# Patient Record
Sex: Female | Born: 1955 | Race: White | Hispanic: No | Marital: Married | Smoking: Never smoker
Health system: Southern US, Community
[De-identification: ages and names within clinical notes are randomized; demographics above are authoritative.]

## PROBLEM LIST (undated history)

## (undated) DIAGNOSIS — E119 Type 2 diabetes mellitus without complications: Secondary | ICD-10-CM

## (undated) DIAGNOSIS — K5790 Diverticulosis of intestine, part unspecified, without perforation or abscess without bleeding: Secondary | ICD-10-CM

## (undated) DIAGNOSIS — C50919 Malignant neoplasm of unspecified site of unspecified female breast: Secondary | ICD-10-CM

## (undated) DIAGNOSIS — K219 Gastro-esophageal reflux disease without esophagitis: Secondary | ICD-10-CM

## (undated) DIAGNOSIS — G620 Drug-induced polyneuropathy: Secondary | ICD-10-CM

## (undated) DIAGNOSIS — I4891 Unspecified atrial fibrillation: Secondary | ICD-10-CM

## (undated) DIAGNOSIS — Z9889 Other specified postprocedural states: Secondary | ICD-10-CM

## (undated) DIAGNOSIS — I10 Essential (primary) hypertension: Secondary | ICD-10-CM

## (undated) DIAGNOSIS — C439 Malignant melanoma of skin, unspecified: Secondary | ICD-10-CM

## (undated) DIAGNOSIS — K269 Duodenal ulcer, unspecified as acute or chronic, without hemorrhage or perforation: Secondary | ICD-10-CM

## (undated) DIAGNOSIS — K449 Diaphragmatic hernia without obstruction or gangrene: Secondary | ICD-10-CM

## (undated) DIAGNOSIS — D126 Benign neoplasm of colon, unspecified: Secondary | ICD-10-CM

## (undated) DIAGNOSIS — T4145XA Adverse effect of unspecified anesthetic, initial encounter: Secondary | ICD-10-CM

## (undated) DIAGNOSIS — T451X5A Adverse effect of antineoplastic and immunosuppressive drugs, initial encounter: Secondary | ICD-10-CM

## (undated) DIAGNOSIS — K259 Gastric ulcer, unspecified as acute or chronic, without hemorrhage or perforation: Secondary | ICD-10-CM

## (undated) DIAGNOSIS — M1990 Unspecified osteoarthritis, unspecified site: Secondary | ICD-10-CM

## (undated) DIAGNOSIS — L409 Psoriasis, unspecified: Secondary | ICD-10-CM

## (undated) DIAGNOSIS — R112 Nausea with vomiting, unspecified: Secondary | ICD-10-CM

## (undated) DIAGNOSIS — E7800 Pure hypercholesterolemia, unspecified: Secondary | ICD-10-CM

## (undated) DIAGNOSIS — T8859XA Other complications of anesthesia, initial encounter: Secondary | ICD-10-CM

## (undated) HISTORY — DX: Duodenal ulcer, unspecified as acute or chronic, without hemorrhage or perforation: K26.9

## (undated) HISTORY — PX: KNEE ARTHROSCOPY: SUR90

## (undated) HISTORY — DX: Unspecified osteoarthritis, unspecified site: M19.90

## (undated) HISTORY — DX: Gastro-esophageal reflux disease without esophagitis: K21.9

## (undated) HISTORY — DX: Malignant neoplasm of unspecified site of unspecified female breast: C50.919

## (undated) HISTORY — DX: Essential (primary) hypertension: I10

## (undated) HISTORY — DX: Diverticulosis of intestine, part unspecified, without perforation or abscess without bleeding: K57.90

## (undated) HISTORY — PX: SUPERFICIAL LYMPH NODE BIOPSY / EXCISION: SUR127

## (undated) HISTORY — DX: Psoriasis, unspecified: L40.9

## (undated) HISTORY — DX: Drug-induced polyneuropathy: G62.0

## (undated) HISTORY — DX: Gastric ulcer, unspecified as acute or chronic, without hemorrhage or perforation: K25.9

## (undated) HISTORY — PX: BREAST LUMPECTOMY: SHX2

## (undated) HISTORY — DX: Pure hypercholesterolemia, unspecified: E78.00

## (undated) HISTORY — DX: Malignant melanoma of skin, unspecified: C43.9

## (undated) HISTORY — DX: Unspecified atrial fibrillation: I48.91

## (undated) HISTORY — DX: Type 2 diabetes mellitus without complications: E11.9

## (undated) HISTORY — PX: INCONTINENCE SURGERY: SHX676

## (undated) HISTORY — DX: Diaphragmatic hernia without obstruction or gangrene: K44.9

## (undated) HISTORY — DX: Benign neoplasm of colon, unspecified: D12.6

## (undated) HISTORY — DX: Adverse effect of antineoplastic and immunosuppressive drugs, initial encounter: T45.1X5A

---

## 1999-03-16 HISTORY — PX: ESOPHAGOGASTRODUODENOSCOPY ENDOSCOPY: SHX5814

## 2008-12-25 ENCOUNTER — Ambulatory Visit: Payer: Self-pay | Admitting: Cardiology

## 2009-05-02 ENCOUNTER — Ambulatory Visit: Payer: Self-pay | Admitting: Cardiology

## 2009-05-28 ENCOUNTER — Ambulatory Visit: Admission: RE | Admit: 2009-05-28 | Discharge: 2009-08-26 | Payer: Self-pay | Admitting: Radiation Oncology

## 2009-11-13 DIAGNOSIS — D126 Benign neoplasm of colon, unspecified: Secondary | ICD-10-CM

## 2009-11-13 HISTORY — PX: COLONOSCOPY: SHX174

## 2009-11-13 HISTORY — DX: Benign neoplasm of colon, unspecified: D12.6

## 2009-11-25 ENCOUNTER — Ambulatory Visit: Payer: Self-pay | Admitting: Cardiology

## 2010-06-24 DIAGNOSIS — I059 Rheumatic mitral valve disease, unspecified: Secondary | ICD-10-CM

## 2010-11-24 ENCOUNTER — Other Ambulatory Visit (HOSPITAL_COMMUNITY): Payer: Self-pay | Admitting: Internal Medicine

## 2010-11-24 DIAGNOSIS — C50919 Malignant neoplasm of unspecified site of unspecified female breast: Secondary | ICD-10-CM

## 2010-12-04 ENCOUNTER — Other Ambulatory Visit (HOSPITAL_COMMUNITY): Payer: BC Managed Care – PPO

## 2011-01-12 ENCOUNTER — Other Ambulatory Visit: Payer: Self-pay | Admitting: Radiation Oncology

## 2011-01-12 DIAGNOSIS — C50919 Malignant neoplasm of unspecified site of unspecified female breast: Secondary | ICD-10-CM

## 2011-01-22 ENCOUNTER — Telehealth: Payer: Self-pay | Admitting: *Deleted

## 2011-01-25 ENCOUNTER — Other Ambulatory Visit (HOSPITAL_COMMUNITY): Payer: BC Managed Care – PPO

## 2011-01-29 ENCOUNTER — Other Ambulatory Visit: Payer: Self-pay | Admitting: Radiation Oncology

## 2011-02-08 ENCOUNTER — Ambulatory Visit: Payer: BC Managed Care – PPO | Admitting: Genetic Counselor

## 2011-02-08 ENCOUNTER — Inpatient Hospital Stay (HOSPITAL_COMMUNITY): Admission: RE | Admit: 2011-02-08 | Payer: BC Managed Care – PPO | Source: Ambulatory Visit

## 2011-02-08 NOTE — Progress Notes (Signed)
Patient cancelled genetic counseling appointment. Will reschedule at a later time

## 2011-02-24 ENCOUNTER — Telehealth: Payer: Self-pay | Admitting: Radiation Oncology

## 2011-02-24 ENCOUNTER — Encounter (HOSPITAL_COMMUNITY)
Admission: RE | Admit: 2011-02-24 | Discharge: 2011-02-24 | Disposition: A | Discharge status: Home / Self Care | Payer: BC Managed Care – PPO | Source: Ambulatory Visit | Attending: Radiation Oncology | Admitting: Radiation Oncology

## 2011-02-24 DIAGNOSIS — C50919 Malignant neoplasm of unspecified site of unspecified female breast: Secondary | ICD-10-CM | POA: Insufficient documentation

## 2011-02-24 LAB — GLUCOSE, CAPILLARY: Glucose-Capillary: 124 mg/dL — ABNORMAL HIGH (ref 70–99)

## 2011-02-24 MED ORDER — FLUDEOXYGLUCOSE F - 18 (FDG) INJECTION
15.6000 | Freq: Once | INTRAVENOUS | Status: AC | PRN
  Administered 2011-02-24: 15.6 via INTRAVENOUS

## 2011-02-24 NOTE — Telephone Encounter (Signed)
Called pt re: PET scan results. She was elated. F/u with me on 1/4 at [facility]

## 2011-03-01 ENCOUNTER — Ambulatory Visit: Payer: BC Managed Care – PPO

## 2011-03-01 NOTE — Progress Notes (Signed)
Patient seen for genetic counseling. She pursued BRCA1/2 testing at [facility]. TAT 2 weeks.

## 2011-03-22 ENCOUNTER — Ambulatory Visit: Payer: Self-pay | Admitting: Genetic Counselor

## 2011-03-22 NOTE — Progress Notes (Signed)
Disclosed negative BRCA1/2 result (including [facility]) to patient today. Results and letter will be scanned into Epic

## 2011-05-07 NOTE — Telephone Encounter (Signed)
XXXX 

## 2011-08-13 ENCOUNTER — Encounter: Payer: BC Managed Care – PPO | Admitting: Internal Medicine

## 2011-08-13 DIAGNOSIS — L408 Other psoriasis: Secondary | ICD-10-CM

## 2011-08-13 DIAGNOSIS — C50919 Malignant neoplasm of unspecified site of unspecified female breast: Secondary | ICD-10-CM

## 2011-08-23 ENCOUNTER — Other Ambulatory Visit (HOSPITAL_COMMUNITY): Payer: Self-pay | Admitting: Internal Medicine

## 2011-08-23 DIAGNOSIS — Z853 Personal history of malignant neoplasm of breast: Secondary | ICD-10-CM

## 2011-08-23 DIAGNOSIS — Z9889 Other specified postprocedural states: Secondary | ICD-10-CM

## 2011-08-24 ENCOUNTER — Other Ambulatory Visit (HOSPITAL_COMMUNITY): Payer: Self-pay | Admitting: Internal Medicine

## 2011-08-24 DIAGNOSIS — Z9889 Other specified postprocedural states: Secondary | ICD-10-CM

## 2011-08-24 DIAGNOSIS — Z853 Personal history of malignant neoplasm of breast: Secondary | ICD-10-CM

## 2011-09-28 ENCOUNTER — Encounter: Payer: BC Managed Care – PPO | Admitting: Internal Medicine

## 2011-09-28 DIAGNOSIS — Z452 Encounter for adjustment and management of vascular access device: Secondary | ICD-10-CM

## 2011-09-28 DIAGNOSIS — C50919 Malignant neoplasm of unspecified site of unspecified female breast: Secondary | ICD-10-CM

## 2011-10-08 ENCOUNTER — Other Ambulatory Visit: Payer: Self-pay | Admitting: Radiation Oncology

## 2011-11-05 ENCOUNTER — Other Ambulatory Visit (HOSPITAL_COMMUNITY): Payer: Self-pay | Admitting: Internal Medicine

## 2011-11-05 ENCOUNTER — Other Ambulatory Visit: Payer: BC Managed Care – PPO

## 2011-11-09 DIAGNOSIS — Z452 Encounter for adjustment and management of vascular access device: Secondary | ICD-10-CM

## 2011-11-09 DIAGNOSIS — C50919 Malignant neoplasm of unspecified site of unspecified female breast: Secondary | ICD-10-CM

## 2011-12-09 ENCOUNTER — Encounter (INDEPENDENT_AMBULATORY_CARE_PROVIDER_SITE_OTHER): Payer: BC Managed Care – PPO | Admitting: Internal Medicine

## 2011-12-09 DIAGNOSIS — C50919 Malignant neoplasm of unspecified site of unspecified female breast: Secondary | ICD-10-CM

## 2012-05-25 ENCOUNTER — Other Ambulatory Visit (HOSPITAL_COMMUNITY): Payer: Self-pay | Admitting: Internal Medicine

## 2012-05-25 ENCOUNTER — Encounter (INDEPENDENT_AMBULATORY_CARE_PROVIDER_SITE_OTHER): Payer: BC Managed Care – PPO | Admitting: Internal Medicine

## 2012-05-25 DIAGNOSIS — Z9889 Other specified postprocedural states: Secondary | ICD-10-CM

## 2012-05-25 DIAGNOSIS — C50919 Malignant neoplasm of unspecified site of unspecified female breast: Secondary | ICD-10-CM

## 2012-05-25 DIAGNOSIS — G589 Mononeuropathy, unspecified: Secondary | ICD-10-CM

## 2012-05-25 DIAGNOSIS — L408 Other psoriasis: Secondary | ICD-10-CM

## 2012-05-25 DIAGNOSIS — Z853 Personal history of malignant neoplasm of breast: Secondary | ICD-10-CM

## 2012-05-30 ENCOUNTER — Other Ambulatory Visit (HOSPITAL_COMMUNITY): Payer: Self-pay | Admitting: Internal Medicine

## 2012-05-30 DIAGNOSIS — Z853 Personal history of malignant neoplasm of breast: Secondary | ICD-10-CM

## 2012-09-28 ENCOUNTER — Ambulatory Visit
Admission: RE | Admit: 2012-09-28 | Discharge: 2012-09-28 | Disposition: A | Discharge status: Home / Self Care | Payer: BC Managed Care – PPO | Source: Ambulatory Visit | Attending: Internal Medicine | Admitting: Internal Medicine

## 2012-09-28 DIAGNOSIS — Z853 Personal history of malignant neoplasm of breast: Secondary | ICD-10-CM

## 2012-09-28 DIAGNOSIS — Z9889 Other specified postprocedural states: Secondary | ICD-10-CM

## 2012-09-28 MED ORDER — GADOBENATE DIMEGLUMINE 529 MG/ML IV SOLN
17.0000 mL | Freq: Once | INTRAVENOUS | Status: AC | PRN
  Administered 2012-09-28: 17 mL via INTRAVENOUS

## 2014-02-25 ENCOUNTER — Encounter: Payer: Self-pay | Admitting: Gastroenterology

## 2014-04-19 ENCOUNTER — Ambulatory Visit: Payer: BC Managed Care – PPO | Admitting: Gastroenterology

## 2014-04-24 ENCOUNTER — Encounter: Payer: Self-pay | Admitting: Internal Medicine

## 2014-05-13 ENCOUNTER — Encounter (INDEPENDENT_AMBULATORY_CARE_PROVIDER_SITE_OTHER): Payer: Self-pay

## 2014-05-13 ENCOUNTER — Ambulatory Visit (INDEPENDENT_AMBULATORY_CARE_PROVIDER_SITE_OTHER): Payer: Medicare Other | Admitting: Nurse Practitioner

## 2014-05-13 ENCOUNTER — Other Ambulatory Visit: Payer: Self-pay

## 2014-05-13 ENCOUNTER — Encounter: Payer: Self-pay | Admitting: Nurse Practitioner

## 2014-05-13 VITALS — BP 139/86 | HR 67 | Temp 97.2°F | Ht 64.0 in | Wt 196.0 lb

## 2014-05-13 DIAGNOSIS — Z8711 Personal history of peptic ulcer disease: Secondary | ICD-10-CM | POA: Insufficient documentation

## 2014-05-13 DIAGNOSIS — K219 Gastro-esophageal reflux disease without esophagitis: Secondary | ICD-10-CM

## 2014-05-13 DIAGNOSIS — Z86010 Personal history of colonic polyps: Secondary | ICD-10-CM | POA: Insufficient documentation

## 2014-05-13 DIAGNOSIS — R1013 Epigastric pain: Secondary | ICD-10-CM | POA: Insufficient documentation

## 2014-05-13 DIAGNOSIS — Z860101 Personal history of adenomatous and serrated colon polyps: Secondary | ICD-10-CM | POA: Insufficient documentation

## 2014-05-13 DIAGNOSIS — Z8719 Personal history of other diseases of the digestive system: Secondary | ICD-10-CM

## 2014-05-13 LAB — CBC
HCT: 39.9 % (ref 36.0–46.0)
Hemoglobin: 12.9 g/dL (ref 12.0–15.0)
MCH: 26.5 pg (ref 26.0–34.0)
MCHC: 32.3 g/dL (ref 30.0–36.0)
MCV: 82.1 fL (ref 78.0–100.0)
MPV: 9.6 fL (ref 8.6–12.4)
PLATELETS: 219 10*3/uL (ref 150–400)
RBC: 4.86 MIL/uL (ref 3.87–5.11)
RDW: 15.1 % (ref 11.5–15.5)
WBC: 8 10*3/uL (ref 4.0–10.5)

## 2014-05-13 LAB — BASIC METABOLIC PANEL
BUN: 15 mg/dL (ref 6–23)
CHLORIDE: 104 meq/L (ref 96–112)
CO2: 26 mEq/L (ref 19–32)
Calcium: 9.5 mg/dL (ref 8.4–10.5)
Creat: 0.62 mg/dL (ref 0.50–1.10)
Glucose, Bld: 114 mg/dL — ABNORMAL HIGH (ref 70–99)
POTASSIUM: 4.7 meq/L (ref 3.5–5.3)
Sodium: 140 mEq/L (ref 135–145)

## 2014-05-13 MED ORDER — PEG-KCL-NACL-NASULF-NA ASC-C 100 G PO SOLR: 1.0000 | ORAL | Status: DC

## 2014-05-13 NOTE — Patient Instructions (Addendum)
1. Have your labs drawn in the next several days 2. We will schedule your procedures (upper endoscopy and colonoscopy) for you 3. Increase your Prilosec to 20 mg twice a day 4. Check your Alka-seltzer for any aspirin or other NSAIDs and avoid if it contains them 5. Further recommendations to be based on the results of the procedure.

## 2014-05-13 NOTE — Assessment & Plan Note (Addendum)
History of GERD with worsening symptoms including dyspepsia and excessive belching. History of gastric and duodenal bleeding ulcers, currently on Meloxicam and recent taking Alk Seltzer with likely ASA component. Last EGD approximately 15 years ago, records not available but they have been requested and will be reviewed when received. Etiology of symptoms likely GERD with possible esophagitis/gastritis componant but cannot rule out worsening symptomology of known sliding hiatal hernia. Will plan for EGD in conjunction with her needed colonoscopy. Will plan for EGD in conjunction with her needed colonoscopy. Will also increase Prilosec to bid in the meantime. Will order CBC and BMP.  Proceed with TCS and EGD with Dr. Gala Romney in near future: the risks, benefits, and alternatives have been discussed with the patient in detail. The patient states understanding and desires to proceed.

## 2014-05-13 NOTE — Progress Notes (Signed)
Primary Care Physician:  Lavonia Dana, MD Primary Gastroenterologist:  Dr. Gala Romney  Chief Complaint  Patient presents with  . Bloated  . burping  . Gas  . Gastrophageal Reflux    HPI:   59 year old female presents to establish care upon transfer from previous GI (Dr. West Carbo) in [place]. Records reviewed that were provided and indicate last colonoscopy 12/02/2009 for screening with small polyp removed from the descending colon that pathology showed as adenoma. Recommended repeat surveilance at that time was 5 years, which would be 11/2014 (this year). No other records provided by previous MD. Reviewed/updated PMS, PSH, FM, and Social history.  Today she states she's having issues with bloating, gas, and belching. At this time she has progressed that she's unable to burp and will have to massage her abdomen at which point she can subsequently belch up to 20 times in a row. PCP added Zantac in the evening without relief as well as gallbladder US which patient states was negative. Has found some relief with anti-gas medication and Alka Seltzer. States she's worried because of the amount of cancer she's had. Admits daily epigastric pain which is described as "uncomfortable" and pressure. This has been occurring for about the past 3 months. Denies N/V/D, bitter taste in her mouth. Denies hematemesis, hematochezia and melena. Takes Meloxicam. Does not take any other NSAIDs or ASA powders. Denies any other upper or lower GI symptoms. Will request further records (specifically results of EGD) from previous GI.  Past Medical History  Diagnosis Date  . Adenomatous polyp of colon 11/2009    recommended repeat in 5 years  . GERD (gastroesophageal reflux disease)   . Hiatal hernia   . Diverticulosis   . Diabetes type 2, controlled   . Hypercholesteremia   . HTN (hypertension)   . Breast CA 2010 (L) and 2012 (R)    Bilateral  . Chemotherapy-induced neuropathy     bilateral hands and feet  .  OA (osteoarthritis)     feets, hands, knees, back.    Past Surgical History  Procedure Laterality Date  . Colonoscopy  11/2009    1 descending colon adenomatous polyp; repeat in 5 years  . Breast lumpectomy Bilateral 2010 (L) & 2012 (R)  . Superficial lymph node biopsy / excision Bilateral     Bilateral breast associated  . Incontinence surgery    . Knee arthroscopy      Current Outpatient Prescriptions  Medication Sig Dispense Refill  . atenolol (TENORMIN) 25 MG tablet Take by mouth daily.    . calcium-vitamin D (OSCAL WITH D) 500-200 MG-UNIT per tablet Take 2 tablets by mouth.    Marland Kitchen exemestane (AROMASIN) 25 MG tablet TAKE 1 TABLET BY MOUTH EVERY DAY    . gabapentin (NEURONTIN) 800 MG tablet TAKE 1 TABLET BY MOUTH AT BEDTIME    . losartan (COZAAR) 50 MG tablet Take 50 mg by mouth.    . meloxicam (MOBIC) 15 MG tablet Take 15 mg by mouth.    . Multiple Vitamins-Minerals (CENTRUM SILVER ADULT 50+ PO) Take 1 tablet by mouth.    Marland Kitchen omeprazole (PRILOSEC) 20 MG capsule Take 20 mg by mouth.    . sertraline (ZOLOFT) 50 MG tablet Take 50 mg by mouth daily.  1  . simvastatin (ZOCOR) 20 MG tablet Take 20 mg by mouth.    . traMADol-acetaminophen (ULTRACET) 37.5-325 MG per tablet Take 1 tablet by mouth.     No current facility-administered medications for this visit.  Allergies as of 05/13/2014 - Review Complete 05/13/2014  Allergen Reaction Noted  . Celebrex [celecoxib]  02/24/2011  . Sulfa antibiotics  02/24/2011    Family History  Problem Relation Age of Onset  . Colon cancer Neg Hx   . Heart disease Mother   . High blood pressure Mother   . Diabetes Mellitus II Mother     History   Social History  . Marital Status: Married    Spouse Name: N/A  . Number of Children: N/A  . Years of Education: N/A   Occupational History  . Not on file.   Social History Main Topics  . Smoking status: Never Smoker   . Smokeless tobacco: Not on file  . Alcohol Use: No  . Drug Use: No    . Sexual Activity: Not on file   Other Topics Concern  . Not on file   Social History Narrative  . No narrative on file    Review of Systems: Gen: Denies any fever, chills, fatigue, unintentional weight loss, lack of appetite.  CV: Denies chest pain, peripheral edema, syncope. Has occasional palpitations which is seen by cardiology and monitored.  Resp: Denies shortness of breath at rest or with exertion. Denies wheezing.  GI: See HPI. Denies dysphagia or odynophagia. Denies jaundice, hematemesis. MS: Denies joint pain, muscle weakness, cramps, or limitation of movement.  Derm: Denies rash, itching, dry skin Psych: Denies depression, anxiety, memory loss, and confusion Heme: Denies bruising, bleeding, and enlarged lymph nodes.  Physical Exam: BP 139/86 mmHg  Pulse 67  Temp(Src) 97.2 F (36.2 C)  Ht 5\' 4"  (1.626 m)  Wt 196 lb (88.905 kg)  BMI 33.63 kg/m2 General:   Alert and oriented. Pleasant and cooperative. Well-nourished and well-developed.  Head:  Normocephalic and atraumatic. Eyes:  Without icterus, sclera clear and conjunctiva pink.  Ears:  Normal auditory acuity. Mouth:  No deformity or lesions, oral mucosa pink.  Neck:  Supple, without mass or thyromegaly. Lungs:  Clear to auscultation bilaterally. No wheezes, rales, or rhonchi. No distress.  Heart:  S1, S2 present without murmurs appreciated.  Abdomen:  +BS, soft, and non-distended. Mild to moderate epigastric TTP. No HSM noted. No guarding or rebound. No masses appreciated.  Rectal:  Deferred  Msk:  Symmetrical without gross deformities. Normal posture. Pulses:  Normal DP pulses noted. Extremities:  Without clubbing. Mile non-pitted ankle edema noted bilaterally. Neurologic:  Alert and  oriented x4;  grossly normal neurologically. Skin:  Intact without significant lesions or rashes. Cervical Nodes:  No significant cervical adenopathy. Psych:  Alert and cooperative. Normal mood and affect.     05/13/2014  10:34 AM

## 2014-05-13 NOTE — Assessment & Plan Note (Addendum)
History of GERD with worsening symptoms including dyspepsia and excessive belching. History of gastric and duodenal bleeding ulcers, currently on Meloxicam and recent taking Alk Seltzer with likely ASA component. Last EGD approximately 15 years ago, records not available but they have been requested and will be reviewed when received. Etiology of symptoms likely GERD with possible esophagitis/gastritis componant but cannot rule out worsening symptomology of known sliding hiatal hernia. Will plan for EGD in conjunction with her needed colonoscopy. Will also increase Prilosec to bid in the meantime. Will order CBC and BMP.  Proceed with TCS and EGD with Dr. Gala Romney in near future: the risks, benefits, and alternatives have been discussed with the patient in detail. The patient states understanding and desires to proceed.

## 2014-05-13 NOTE — Assessment & Plan Note (Signed)
History of adenoma on last colonoscopy 2011 and recommended repeat in 5 years (2016) and would be due this year. Only new symptoms seem to be more upper GI related (belching, epigastric pressure. 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 Denies any GI bleed or other warning/red flag signs. Will go ahead and arrange for repeat colonoscopy now because she's due this year as well as need for EGD for upper GI symptoms.  Proceed with TCS + EGD with Dr. Gala Romney in near future: the risks, benefits, and alternatives have been discussed with the patient in detail. The patient states understanding and desires to proceed.

## 2014-05-13 NOTE — Addendum Note (Signed)
Addended by: Gordy Levan, ERIC A on: 05/13/2014 02:11 PM   Modules accepted: Level of Service

## 2014-05-14 NOTE — Progress Notes (Signed)
CC'ED TO PCP 

## 2014-05-29 ENCOUNTER — Encounter (HOSPITAL_COMMUNITY)
Admission: RE | Disposition: A | Discharge status: Home / Self Care | Payer: Self-pay | Source: Ambulatory Visit | Attending: Internal Medicine

## 2014-05-29 ENCOUNTER — Encounter (HOSPITAL_COMMUNITY): Payer: Self-pay | Admitting: *Deleted

## 2014-05-29 ENCOUNTER — Ambulatory Visit (HOSPITAL_COMMUNITY)
Admission: RE | Admit: 2014-05-29 | Discharge: 2014-05-29 | Disposition: A | Discharge status: Home / Self Care | Payer: Medicare Other | Source: Ambulatory Visit | Attending: Internal Medicine | Admitting: Internal Medicine

## 2014-05-29 DIAGNOSIS — G620 Drug-induced polyneuropathy: Secondary | ICD-10-CM | POA: Insufficient documentation

## 2014-05-29 DIAGNOSIS — M19071 Primary osteoarthritis, right ankle and foot: Secondary | ICD-10-CM | POA: Diagnosis not present

## 2014-05-29 DIAGNOSIS — K6389 Other specified diseases of intestine: Secondary | ICD-10-CM | POA: Diagnosis not present

## 2014-05-29 DIAGNOSIS — K219 Gastro-esophageal reflux disease without esophagitis: Secondary | ICD-10-CM

## 2014-05-29 DIAGNOSIS — R1013 Epigastric pain: Secondary | ICD-10-CM | POA: Diagnosis present

## 2014-05-29 DIAGNOSIS — E780 Pure hypercholesterolemia: Secondary | ICD-10-CM | POA: Diagnosis not present

## 2014-05-29 DIAGNOSIS — K2950 Unspecified chronic gastritis without bleeding: Secondary | ICD-10-CM | POA: Insufficient documentation

## 2014-05-29 DIAGNOSIS — K5730 Diverticulosis of large intestine without perforation or abscess without bleeding: Secondary | ICD-10-CM | POA: Diagnosis not present

## 2014-05-29 DIAGNOSIS — Z1211 Encounter for screening for malignant neoplasm of colon: Secondary | ICD-10-CM | POA: Diagnosis not present

## 2014-05-29 DIAGNOSIS — M19072 Primary osteoarthritis, left ankle and foot: Secondary | ICD-10-CM | POA: Diagnosis not present

## 2014-05-29 DIAGNOSIS — Z853 Personal history of malignant neoplasm of breast: Secondary | ICD-10-CM | POA: Insufficient documentation

## 2014-05-29 DIAGNOSIS — K648 Other hemorrhoids: Secondary | ICD-10-CM | POA: Insufficient documentation

## 2014-05-29 DIAGNOSIS — E119 Type 2 diabetes mellitus without complications: Secondary | ICD-10-CM | POA: Diagnosis not present

## 2014-05-29 DIAGNOSIS — Z860100 Personal history of colon polyps, unspecified: Secondary | ICD-10-CM | POA: Insufficient documentation

## 2014-05-29 DIAGNOSIS — K317 Polyp of stomach and duodenum: Secondary | ICD-10-CM | POA: Diagnosis not present

## 2014-05-29 DIAGNOSIS — I10 Essential (primary) hypertension: Secondary | ICD-10-CM | POA: Insufficient documentation

## 2014-05-29 DIAGNOSIS — Z882 Allergy status to sulfonamides status: Secondary | ICD-10-CM | POA: Insufficient documentation

## 2014-05-29 DIAGNOSIS — K3189 Other diseases of stomach and duodenum: Secondary | ICD-10-CM | POA: Insufficient documentation

## 2014-05-29 DIAGNOSIS — Z9013 Acquired absence of bilateral breasts and nipples: Secondary | ICD-10-CM | POA: Diagnosis not present

## 2014-05-29 DIAGNOSIS — K449 Diaphragmatic hernia without obstruction or gangrene: Secondary | ICD-10-CM | POA: Insufficient documentation

## 2014-05-29 DIAGNOSIS — Z888 Allergy status to other drugs, medicaments and biological substances status: Secondary | ICD-10-CM | POA: Diagnosis not present

## 2014-05-29 DIAGNOSIS — M19041 Primary osteoarthritis, right hand: Secondary | ICD-10-CM | POA: Diagnosis not present

## 2014-05-29 DIAGNOSIS — M19042 Primary osteoarthritis, left hand: Secondary | ICD-10-CM | POA: Diagnosis not present

## 2014-05-29 HISTORY — PX: ESOPHAGOGASTRODUODENOSCOPY: SHX5428

## 2014-05-29 HISTORY — DX: Other specified postprocedural states: Z98.890

## 2014-05-29 HISTORY — PX: COLONOSCOPY: SHX5424

## 2014-05-29 HISTORY — DX: Other specified postprocedural states: R11.2

## 2014-05-29 HISTORY — DX: Other complications of anesthesia, initial encounter: T88.59XA

## 2014-05-29 HISTORY — DX: Adverse effect of unspecified anesthetic, initial encounter: T41.45XA

## 2014-05-29 LAB — GLUCOSE, CAPILLARY: GLUCOSE-CAPILLARY: 158 mg/dL — AB (ref 70–99)

## 2014-05-29 SURGERY — COLONOSCOPY
Anesthesia: Moderate Sedation

## 2014-05-29 MED ORDER — MIDAZOLAM HCL 5 MG/5ML IJ SOLN
INTRAMUSCULAR | Status: AC
  Filled 2014-05-29: qty 10

## 2014-05-29 MED ORDER — SODIUM CHLORIDE 0.9 % IV SOLN: INTRAVENOUS | Status: DC

## 2014-05-29 MED ORDER — MEPERIDINE HCL 100 MG/ML IJ SOLN
INTRAMUSCULAR | Status: AC
  Filled 2014-05-29: qty 2

## 2014-05-29 MED ORDER — LIDOCAINE VISCOUS 2 % MT SOLN
OROMUCOSAL | Status: DC | PRN
  Administered 2014-05-29: 3 mL via OROMUCOSAL

## 2014-05-29 MED ORDER — MIDAZOLAM HCL 5 MG/5ML IJ SOLN
INTRAMUSCULAR | Status: DC | PRN
  Administered 2014-05-29 (×4): 1 mg via INTRAVENOUS
  Administered 2014-05-29 (×2): 2 mg via INTRAVENOUS
  Administered 2014-05-29: 1 mg via INTRAVENOUS

## 2014-05-29 MED ORDER — ONDANSETRON HCL 4 MG/2ML IJ SOLN
INTRAMUSCULAR | Status: AC
  Filled 2014-05-29: qty 2

## 2014-05-29 MED ORDER — STERILE WATER FOR IRRIGATION IR SOLN
Status: DC | PRN
  Administered 2014-05-29: 13:00:00

## 2014-05-29 MED ORDER — MEPERIDINE HCL 100 MG/ML IJ SOLN
INTRAMUSCULAR | Status: DC | PRN
  Administered 2014-05-29 (×2): 25 mg via INTRAVENOUS
  Administered 2014-05-29: 50 mg via INTRAVENOUS
  Administered 2014-05-29: 25 mg via INTRAVENOUS

## 2014-05-29 MED ORDER — ONDANSETRON HCL 4 MG/2ML IJ SOLN
INTRAMUSCULAR | Status: DC | PRN
  Administered 2014-05-29: 4 mg via INTRAVENOUS

## 2014-05-29 MED ORDER — LIDOCAINE VISCOUS 2 % MT SOLN
OROMUCOSAL | Status: AC
  Filled 2014-05-29: qty 15

## 2014-05-29 NOTE — H&P (View-Only) (Signed)
Primary Care Physician:  Lavonia Dana, MD Primary Gastroenterologist:  Dr. Gala Romney  Chief Complaint  Patient presents with  . Bloated  . burping  . Gas  . Gastrophageal Reflux    HPI:   59 year old female presents to establish care upon transfer from previous GI (Dr. West Carbo) in [place]. Records reviewed that were provided and indicate last colonoscopy 12/02/2009 for screening with small polyp removed from the descending colon that pathology showed as adenoma. Recommended repeat surveilance at that time was 5 years, which would be 11/2014 (this year). No other records provided by previous MD. Reviewed/updated PMS, PSH, FM, and Social history.  Today she states she's having issues with bloating, gas, and belching. At this time she has progressed that she's unable to burp and will have to massage her abdomen at which point she can subsequently belch up to 20 times in a row. PCP added Zantac in the evening without relief as well as gallbladder US which patient states was negative. Has found some relief with anti-gas medication and Alka Seltzer. States she's worried because of the amount of cancer she's had. Admits daily epigastric pain which is described as "uncomfortable" and pressure. This has been occurring for about the past 3 months. Denies N/V/D, bitter taste in her mouth. Denies hematemesis, hematochezia and melena. Takes Meloxicam. Does not take any other NSAIDs or ASA powders. Denies any other upper or lower GI symptoms. Will request further records (specifically results of EGD) from previous GI.  Past Medical History  Diagnosis Date  . Adenomatous polyp of colon 11/2009    recommended repeat in 5 years  . GERD (gastroesophageal reflux disease)   . Hiatal hernia   . Diverticulosis   . Diabetes type 2, controlled   . Hypercholesteremia   . HTN (hypertension)   . Breast CA 2010 (L) and 2012 (R)    Bilateral  . Chemotherapy-induced neuropathy     bilateral hands and feet  .  OA (osteoarthritis)     feets, hands, knees, back.    Past Surgical History  Procedure Laterality Date  . Colonoscopy  11/2009    1 descending colon adenomatous polyp; repeat in 5 years  . Breast lumpectomy Bilateral 2010 (L) & 2012 (R)  . Superficial lymph node biopsy / excision Bilateral     Bilateral breast associated  . Incontinence surgery    . Knee arthroscopy      Current Outpatient Prescriptions  Medication Sig Dispense Refill  . atenolol (TENORMIN) 25 MG tablet Take by mouth daily.    . calcium-vitamin D (OSCAL WITH D) 500-200 MG-UNIT per tablet Take 2 tablets by mouth.    Marland Kitchen exemestane (AROMASIN) 25 MG tablet TAKE 1 TABLET BY MOUTH EVERY DAY    . gabapentin (NEURONTIN) 800 MG tablet TAKE 1 TABLET BY MOUTH AT BEDTIME    . losartan (COZAAR) 50 MG tablet Take 50 mg by mouth.    . meloxicam (MOBIC) 15 MG tablet Take 15 mg by mouth.    . Multiple Vitamins-Minerals (CENTRUM SILVER ADULT 50+ PO) Take 1 tablet by mouth.    Marland Kitchen omeprazole (PRILOSEC) 20 MG capsule Take 20 mg by mouth.    . sertraline (ZOLOFT) 50 MG tablet Take 50 mg by mouth daily.  1  . simvastatin (ZOCOR) 20 MG tablet Take 20 mg by mouth.    . traMADol-acetaminophen (ULTRACET) 37.5-325 MG per tablet Take 1 tablet by mouth.     No current facility-administered medications for this visit.  Allergies as of 05/13/2014 - Review Complete 05/13/2014  Allergen Reaction Noted  . Celebrex [celecoxib]  02/24/2011  . Sulfa antibiotics  02/24/2011    Family History  Problem Relation Age of Onset  . Colon cancer Neg Hx   . Heart disease Mother   . High blood pressure Mother   . Diabetes Mellitus II Mother     History   Social History  . Marital Status: Married    Spouse Name: N/A  . Number of Children: N/A  . Years of Education: N/A   Occupational History  . Not on file.   Social History Main Topics  . Smoking status: Never Smoker   . Smokeless tobacco: Not on file  . Alcohol Use: No  . Drug Use: No    . Sexual Activity: Not on file   Other Topics Concern  . Not on file   Social History Narrative  . No narrative on file    Review of Systems: Gen: Denies any fever, chills, fatigue, unintentional weight loss, lack of appetite.  CV: Denies chest pain, peripheral edema, syncope. Has occasional palpitations which is seen by cardiology and monitored.  Resp: Denies shortness of breath at rest or with exertion. Denies wheezing.  GI: See HPI. Denies dysphagia or odynophagia. Denies jaundice, hematemesis. MS: Denies joint pain, muscle weakness, cramps, or limitation of movement.  Derm: Denies rash, itching, dry skin Psych: Denies depression, anxiety, memory loss, and confusion Heme: Denies bruising, bleeding, and enlarged lymph nodes.  Physical Exam: BP 139/86 mmHg  Pulse 67  Temp(Src) 97.2 F (36.2 C)  Ht 5\' 4"  (1.626 m)  Wt 196 lb (88.905 kg)  BMI 33.63 kg/m2 General:   Alert and oriented. Pleasant and cooperative. Well-nourished and well-developed.  Head:  Normocephalic and atraumatic. Eyes:  Without icterus, sclera clear and conjunctiva pink.  Ears:  Normal auditory acuity. Mouth:  No deformity or lesions, oral mucosa pink.  Neck:  Supple, without mass or thyromegaly. Lungs:  Clear to auscultation bilaterally. No wheezes, rales, or rhonchi. No distress.  Heart:  S1, S2 present without murmurs appreciated.  Abdomen:  +BS, soft, and non-distended. Mild to moderate epigastric TTP. No HSM noted. No guarding or rebound. No masses appreciated.  Rectal:  Deferred  Msk:  Symmetrical without gross deformities. Normal posture. Pulses:  Normal DP pulses noted. Extremities:  Without clubbing. Mile non-pitted ankle edema noted bilaterally. Neurologic:  Alert and  oriented x4;  grossly normal neurologically. Skin:  Intact without significant lesions or rashes. Cervical Nodes:  No significant cervical adenopathy. Psych:  Alert and cooperative. Normal mood and affect.     05/13/2014  10:34 AM

## 2014-05-29 NOTE — Op Note (Signed)
[facility] [address] [place], [address]   COLONOSCOPY PROCEDURE REPORT  PATIENT: Janet Lawson, Janet Lawson  MR#: [id number] BIRTHDATE: 05-Nov-1955 , 19  yrs. old GENDER: female ENDOSCOPIST: R.  Garfield Cornea, MD [facility] REFERRED BY:   Dr. Philipp Deputy PROCEDURE DATE:  06-27-14 PROCEDURE:   Colonoscopy, surveillance INDICATIONS:   history of colonic adenoma. MEDICATIONS: Versed 9 mg IV and Demerol 125 mg IV in divided doses. Zofran 4 mg IV . ASA CLASS:       Class III  CONSENT: The risks, benefits, alternatives and imponderables including but not limited to bleeding, perforation as well as the possibility of a missed lesion have been reviewed.  The potential for biopsy, lesion removal, etc. have also been discussed. Questions have been answered.  All parties agreeable.  Please see the history and physical in the medical record for more information.  DESCRIPTION OF PROCEDURE:   After the risks benefits and alternatives of the procedure were thoroughly explained, informed consent was obtained.  The digital rectal exam      The EG-[id number] ([id number])  endoscope was introduced through the anus and advanced to the cecum, which was identified by both the appendix and ileocecal valve. No adverse events experienced.   The quality of the prep was adequate  The instrument was then slowly withdrawn as the colon was fully examined.      COLON FINDINGS: Internal hemorrhoids and anal papilla; otherwise normal rectum.  Left-sided diverticula.  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  Retroflexion was performed. .  Withdrawal time=7 minutes 0 seconds.  The scope was withdrawn and the procedure completed. COMPLICATIONS: There were no immediate complications.  ENDOSCOPIC IMPRESSION: Melanosis coli. Colonic diverticulosis  RECOMMENDATIONS: Repeat colonoscopy for surveillance purposes in 5  years.  eSigned:  R. Garfield Cornea, MD Rosalita Chessman [facility] 06/27/14 2:18 PM   cc:  CPT CODES: ICD CODES:  The ICD and CPT codes recommended by this software are interpretations from the data that the clinical staff has captured with the software.  The verification of the translation of this report to the ICD and CPT codes and modifiers is the sole responsibility of the health care institution and practicing physician where this report was generated.  [place]. will not be held responsible for the validity of the ICD and CPT codes included on this report.  AMA assumes no liability for data contained or not contained herein. CPT is a Designer, television/film set of the [employer].  PATIENT NAME:  Janet Lawson, Janet Lawson MR#: [id number]

## 2014-05-29 NOTE — Op Note (Signed)
[place], [address]   ENDOSCOPY PROCEDURE REPORT  PATIENT: Janet Lawson, Janet Lawson  MR#: [id number] BIRTHDATE: [birth year] , 89  yrs. old GENDER: female ENDOSCOPIST: R.  Garfield Cornea, MD [facility] REFERRED BY:     Dr. Philipp Deputy PROCEDURE DATE:  06-02-2014 PROCEDURE:  EGD w/ biopsy INDICATIONS:  Dyspepsia. MEDICATIONS: Versed 6 mg IV and Demerol 125 mg IV in divided doses. Zofran 4 mg IV. ASA CLASS:      Class III  CONSENT: The risks, benefits, limitations, alternatives and imponderables have been discussed.  The potential for biopsy, esophogeal dilation, etc. have also been reviewed.  Questions have been answered.  All parties agreeable.  Please see the history and physical in the medical record for more information.  DESCRIPTION OF PROCEDURE: After the risks benefits and alternatives of the procedure were thoroughly explained, informed consent was obtained.  The EG-2990i ([id number]) endoscope was introduced through the mouth and advanced to the second portion of the duodenum , limited by Without limitations. The instrument was slowly withdrawn as the mucosa was fully examined.    Normal appearing esophagus.  Stomach empty.  3 cm hiatal hernia. Multiple fundal gland appearing polyps in the fundus and body. Linear antral erosions present.  No ulcer or infiltrating process. Patent pylorus.  Normal-appearing first and second portion of the duodenum.  Biopsies of the abnormal antral mucosa as well as one of the fundal gland appearing polyps taken for histologic study.  Retroflexed views revealed as previously described.     The scope was then withdrawn from the patient and the procedure completed.  COMPLICATIONS: There were no immediate complications.  ENDOSCOPIC IMPRESSION: Normal esophagus. Hiatal hernia. Gastric polyps. Gastric erosions. Status post biopsy  RECOMMENDATIONS: Follow-up on pathology. Continue Prilosec 20 mg twice daily for  now. Gallbladder ultrasound to be scheduled. See colonoscopy report.  REPEAT EXAM:  eSigned:  R. Garfield Cornea, MD Rosalita Chessman [facility] 06/02/14 1:46 PM    CC:  CPT CODES: ICD CODES:  The ICD and CPT codes recommended by this software are interpretations from the data that the clinical staff has captured with the software.  The verification of the translation of this report to the ICD and CPT codes and modifiers is the sole responsibility of the health care institution and practicing physician where this report was generated.  [place]. will not be held responsible for the validity of the ICD and CPT codes included on this report.  AMA assumes no liability for data contained or not contained herein. CPT is a Designer, television/film set of the [employer].  PATIENT NAME:  [place], Noori MR#: [id number]

## 2014-05-29 NOTE — Discharge Instructions (Addendum)
Colonoscopy Discharge Instructions  Read the instructions outlined below and refer to this sheet in the next few weeks. These discharge instructions provide you with general information on caring for yourself after you leave the hospital. Your doctor may also give you specific instructions. While your treatment has been planned according to the most current medical practices available, unavoidable complications occasionally occur. If you have any problems or questions after discharge, call Dr. Gala Romney at [phone]. ACTIVITY  You may resume your regular activity, but move at a slower pace for the next 24 hours.   Take frequent rest periods for the next 24 hours.   Walking will help get rid of the air and reduce the bloated feeling in your belly (abdomen).   No driving for 24 hours (because of the medicine (anesthesia) used during the test).    Do not sign any important legal documents or operate any machinery for 24 hours (because of the anesthesia used during the test).  NUTRITION  Drink plenty of fluids.   You may resume your normal diet as instructed by your doctor.   Begin with a light meal and progress to your normal diet. Heavy or fried foods are harder to digest and may make you feel sick to your stomach (nauseated).   Avoid alcoholic beverages for 24 hours or as instructed.  MEDICATIONS  You may resume your normal medications unless your doctor tells you otherwise.  WHAT YOU CAN EXPECT TODAY  Some feelings of bloating in the abdomen.   Passage of more gas than usual.   Spotting of blood in your stool or on the toilet paper.  IF YOU HAD POLYPS REMOVED DURING THE COLONOSCOPY:  No aspirin products for 7 days or as instructed.   No alcohol for 7 days or as instructed.   Eat a soft diet for the next 24 hours.  FINDING OUT THE RESULTS OF YOUR TEST Not all test results are available during your visit. If your test results are not back during the visit, make an appointment  with your caregiver to find out the results. Do not assume everything is normal if you have not heard from your caregiver or the medical facility. It is important for you to follow up on all of your test results.  SEEK IMMEDIATE MEDICAL ATTENTION IF:  You have more than a spotting of blood in your stool.   Your belly is swollen (abdominal distention).   You are nauseated or vomiting.   You have a temperature over 101.  You have abdominal pain or discomfort that is severe or gets worse throughout the day. EGD Discharge instructions Please read the instructions outlined below and refer to this sheet in the next few weeks. These discharge instructions provide you with general information on caring for yourself after you leave the hospital. Your doctor may also give you specific instructions. While your treatment has been planned according to the most current medical practices available, unavoidable complications occasionally occur. If you have any problems or questions after discharge, please call your doctor. ACTIVITY You may resume your regular activity but move at a slower pace for the next 24 hours.  Take frequent rest periods for the next 24 hours.  Walking will help expel (get rid of) the air and reduce the bloated feeling in your abdomen.  No driving for 24 hours (because of the anesthesia (medicine) used during the test).  You may shower.  Do not sign any important legal documents or operate any machinery for 24  hours (because of the anesthesia used during the test).  NUTRITION Drink plenty of fluids.  You may resume your normal diet.  Begin with a light meal and progress to your normal diet.  Avoid alcoholic beverages for 24 hours or as instructed by your caregiver.  MEDICATIONS You may resume your normal medications unless your caregiver tells you otherwise.  WHAT YOU CAN EXPECT TODAY You may experience abdominal discomfort such as a feeling of fullness or gas pains.   FOLLOW-UP Your doctor will discuss the results of your test with you.  SEEK IMMEDIATE MEDICAL ATTENTION IF ANY OF THE FOLLOWING OCCUR: Excessive nausea (feeling sick to your stomach) and/or vomiting.  Severe abdominal pain and distention (swelling).  Trouble swallowing.  Temperature over 101 F (37.8 C).  Rectal bleeding or vomiting of blood.    Diverticulosis information provided  Schedule gallbladder ultrasound in the near future to further evaluate symptoms  Continue Prilosec 20 mg twice daily  Further recommendations to follow pending review of pathology report  Diverticulosis Diverticulosis is the condition that develops when small pouches (diverticula) form in the wall of your colon. Your colon, or large intestine, is where water is absorbed and stool is formed. The pouches form when the inside layer of your colon pushes through weak spots in the outer layers of your colon. CAUSES  No one knows exactly what causes diverticulosis. RISK FACTORS  Being older than 45. Your risk for this condition increases with age. Diverticulosis is rare in people younger than 40 years. By age 19, almost everyone has it.  Eating a low-fiber diet.  Being frequently constipated.  Being overweight.  Not getting enough exercise.  Smoking.  Taking over-the-counter pain medicines, like aspirin and ibuprofen. SYMPTOMS  Most people with diverticulosis do not have symptoms. DIAGNOSIS  Because diverticulosis often has no symptoms, health care providers often discover the condition during an exam for other colon problems. In many cases, a health care provider will diagnose diverticulosis while using a flexible scope to examine the colon (colonoscopy). TREATMENT  If you have never developed an infection related to diverticulosis, you may not need treatment. If you have had an infection before, treatment may include:  Eating more fruits, vegetables, and grains.  Taking a fiber  supplement.  Taking a live bacteria supplement (probiotic).  Taking medicine to relax your colon. HOME CARE INSTRUCTIONS   Drink at least 6-8 glasses of water each day to prevent constipation.  Try not to strain when you have a bowel movement.  Keep all follow-up appointments. If you have had an infection before:  Increase the fiber in your diet as directed by your health care provider or dietitian.  Take a dietary fiber supplement if your health care provider approves.  Only take medicines as directed by your health care provider. SEEK MEDICAL CARE IF:   You have abdominal pain.  You have bloating.  You have cramps.  You have not gone to the bathroom in 3 days. SEEK IMMEDIATE MEDICAL CARE IF:   Your pain gets worse.  Yourbloating becomes very bad.  You have a fever or chills, and your symptoms suddenly get worse.  You begin vomiting.  You have bowel movements that are bloody or black. MAKE SURE YOU:  Understand these instructions.  Will watch your condition.  Will get help right away if you are not doing well or get worse. Document Released: 11/27/2003 Document Revised: 03/06/2013 Document Reviewed: 01/24/2013 [facility] Patient Information 2015 [place], [place]. This information is not intended  to replace advice given to you by your health care provider. Make sure you discuss any questions you have with your health care provider. ° °

## 2014-05-29 NOTE — Interval H&P Note (Signed)
History and Physical Interval Note:  05/29/2014 1:18 PM  Janet Lawson  has presented today for surgery, with the diagnosis of GERD/DYPEPSIA/HISTORY OF POLYPS  The various methods of treatment have been discussed with the patient and family. After consideration of risks, benefits and other options for treatment, the patient has consented to  Procedure(s) with comments: COLONOSCOPY (N/A) - 1200 - moved to 1:10 - office to notify pt ESOPHAGOGASTRODUODENOSCOPY (EGD) (N/A) as a surgical intervention .  The patient's history has been reviewed, patient examined, no change in status, stable for surgery.  I have reviewed the patient's chart and labs.  Questions were answered to the patient's satisfaction.     [name]  No dysphagia. No change. EGD and colonoscopy per plan.The risks, benefits, limitations, imponderables and alternatives regarding both EGD and colonoscopy have been reviewed with the patient. Questions have been answered. All parties agreeable.

## 2014-05-30 ENCOUNTER — Encounter (HOSPITAL_COMMUNITY): Payer: Self-pay | Admitting: Internal Medicine

## 2014-06-02 ENCOUNTER — Encounter: Payer: Self-pay | Admitting: Internal Medicine

## 2014-06-03 ENCOUNTER — Other Ambulatory Visit: Payer: Self-pay

## 2014-06-03 DIAGNOSIS — R1013 Epigastric pain: Secondary | ICD-10-CM

## 2014-06-03 DIAGNOSIS — K219 Gastro-esophageal reflux disease without esophagitis: Secondary | ICD-10-CM

## 2014-06-04 ENCOUNTER — Telehealth: Payer: Self-pay

## 2014-06-04 NOTE — Telephone Encounter (Signed)
Called pt and she is aware of [place] appointment on 06/10/2014. States that she will call and reschedule because they are going out of town.   Pt has number and is aware that she needs to be fasting on the day of her [place].  No pre-cert needed per Claiborne Billings at [employer].

## 2014-06-10 ENCOUNTER — Ambulatory Visit (HOSPITAL_COMMUNITY): Payer: Medicare Other

## 2014-06-19 ENCOUNTER — Telehealth: Payer: Self-pay | Admitting: Internal Medicine

## 2014-06-19 MED ORDER — OMEPRAZOLE 20 MG PO CPDR: 20.0000 mg | DELAYED_RELEASE_CAPSULE | Freq: Two times a day (BID) | ORAL | Status: DC

## 2014-06-19 NOTE — Telephone Encounter (Signed)
Pt called asking if RMR would call in a prescription to [facility] in [place] for generic Prilosec that she takes twice a day. Pharmacy phone number is [phone]

## 2014-06-19 NOTE — Addendum Note (Signed)
Addended by: Mahala Menghini on: 06/19/2014 04:30 PM   Modules accepted: Orders

## 2014-06-19 NOTE — Telephone Encounter (Signed)
done

## 2014-06-19 NOTE — Telephone Encounter (Signed)
Routing to the refill box. 

## 2014-06-20 ENCOUNTER — Ambulatory Visit (HOSPITAL_COMMUNITY)
Admission: RE | Admit: 2014-06-20 | Discharge: 2014-06-20 | Disposition: A | Discharge status: Home / Self Care | Payer: Medicare Other | Source: Ambulatory Visit | Attending: Internal Medicine | Admitting: Internal Medicine

## 2014-06-20 DIAGNOSIS — R109 Unspecified abdominal pain: Secondary | ICD-10-CM | POA: Diagnosis not present

## 2014-06-20 DIAGNOSIS — K219 Gastro-esophageal reflux disease without esophagitis: Secondary | ICD-10-CM

## 2014-06-20 DIAGNOSIS — R932 Abnormal findings on diagnostic imaging of liver and biliary tract: Secondary | ICD-10-CM | POA: Diagnosis not present

## 2014-06-20 DIAGNOSIS — R1013 Epigastric pain: Secondary | ICD-10-CM

## 2014-06-26 ENCOUNTER — Telehealth: Payer: Self-pay | Admitting: Internal Medicine

## 2014-06-26 ENCOUNTER — Other Ambulatory Visit: Payer: Self-pay

## 2014-06-26 ENCOUNTER — Other Ambulatory Visit: Payer: Self-pay | Admitting: Internal Medicine

## 2014-06-26 DIAGNOSIS — R932 Abnormal findings on diagnostic imaging of liver and biliary tract: Secondary | ICD-10-CM

## 2014-06-26 DIAGNOSIS — R109 Unspecified abdominal pain: Secondary | ICD-10-CM

## 2014-06-26 NOTE — Telephone Encounter (Signed)
Patient was returning GF call. I told her that GF had left early today for a meeting and would be back tomorrow. She said it was dealing with her MRI. I told her if CJ was available I would let her know, but at the moment she is with another patient. Please call [phone]

## 2014-06-26 NOTE — Telephone Encounter (Signed)
Open in error

## 2014-06-26 NOTE — Telephone Encounter (Signed)
Called and spoke with pt and she is aware of appointment

## 2014-07-01 ENCOUNTER — Encounter: Payer: Self-pay | Admitting: Nurse Practitioner

## 2014-07-03 ENCOUNTER — Ambulatory Visit (HOSPITAL_COMMUNITY)
Admission: RE | Admit: 2014-07-03 | Discharge: 2014-07-03 | Disposition: A | Discharge status: Home / Self Care | Payer: Medicare Other | Source: Ambulatory Visit | Attending: Internal Medicine | Admitting: Internal Medicine

## 2014-07-03 DIAGNOSIS — Z853 Personal history of malignant neoplasm of breast: Secondary | ICD-10-CM | POA: Insufficient documentation

## 2014-07-03 DIAGNOSIS — R932 Abnormal findings on diagnostic imaging of liver and biliary tract: Secondary | ICD-10-CM | POA: Insufficient documentation

## 2014-07-03 DIAGNOSIS — R109 Unspecified abdominal pain: Secondary | ICD-10-CM

## 2014-07-03 LAB — HEPATIC FUNCTION PANEL
ALBUMIN: 4 g/dL (ref 3.5–5.2)
ALK PHOS: 69 U/L (ref 39–117)
ALT: 13 U/L (ref 0–35)
AST: 19 U/L (ref 0–37)
Bilirubin, Direct: 0.1 mg/dL (ref 0.0–0.3)
Indirect Bilirubin: 0.3 mg/dL (ref 0.2–1.2)
Total Bilirubin: 0.4 mg/dL (ref 0.2–1.2)
Total Protein: 6.7 g/dL (ref 6.0–8.3)

## 2014-07-03 LAB — POCT I-STAT CREATININE: CREATININE: 0.7 mg/dL (ref 0.50–1.10)

## 2014-07-03 MED ORDER — GADOBENATE DIMEGLUMINE 529 MG/ML IV SOLN
18.0000 mL | Freq: Once | INTRAVENOUS | Status: AC | PRN
  Administered 2014-07-03: 18 mL via INTRAVENOUS

## 2014-07-08 ENCOUNTER — Encounter: Payer: Self-pay | Admitting: Internal Medicine

## 2014-07-08 NOTE — Telephone Encounter (Signed)
Pt called and requested an earlier appointment with Walden Field, NP. Pt is set up for a follow up from imaging results on 07/10/2014

## 2014-07-08 NOTE — Progress Notes (Signed)
APPOINTMENT MADE AND LETTER SENT °

## 2014-07-10 ENCOUNTER — Ambulatory Visit (INDEPENDENT_AMBULATORY_CARE_PROVIDER_SITE_OTHER): Payer: Medicare Other | Admitting: Nurse Practitioner

## 2014-07-10 ENCOUNTER — Encounter: Payer: Self-pay | Admitting: Nurse Practitioner

## 2014-07-10 VITALS — BP 133/82 | HR 74 | Temp 98.0°F | Ht 64.0 in | Wt 197.0 lb

## 2014-07-10 DIAGNOSIS — R7401 Elevation of levels of liver transaminase levels: Secondary | ICD-10-CM

## 2014-07-10 DIAGNOSIS — K219 Gastro-esophageal reflux disease without esophagitis: Secondary | ICD-10-CM

## 2014-07-10 DIAGNOSIS — R740 Nonspecific elevation of levels of transaminase and lactic acid dehydrogenase [LDH]: Secondary | ICD-10-CM

## 2014-07-10 DIAGNOSIS — R935 Abnormal findings on diagnostic imaging of other abdominal regions, including retroperitoneum: Secondary | ICD-10-CM

## 2014-07-10 MED ORDER — OMEPRAZOLE 20 MG PO CPDR: 20.0000 mg | DELAYED_RELEASE_CAPSULE | Freq: Two times a day (BID) | ORAL | Status: DC

## 2014-07-10 NOTE — Assessment & Plan Note (Signed)
GERD symptoms drastically improved on Prilosec 20 mg twice a day. Per patient request I send in a prescription for 90 day supply with 3 refills. Return for follow-up in 3 months to reevaluate symptoms.

## 2014-07-10 NOTE — Assessment & Plan Note (Addendum)
Abdominal ultrasound and abdominal MRI results discussed the patient extensively. 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 She did have an incidence of transaminitis last August, however her provider noted that she had been on methotrexate for approximately 12 years which has a potential side effect of elevated liver function testing. The methotrexate was discontinued and upon her next liver enzyme check her enzymes have decreased. Her most recent LFTs were normal drawn approximately 2 weeks ago. At this point the patient declines liver biopsy due to her extensive medical history of cancer and repeated invasive testing. We will recheck her hepatic function panel today. She states if her liver enzymes become elevated again she is more amenable to liver biopsy but would definitely prefer transjugular with conscious sedation versus awake transcutaneous liver biopsy. We'll have her return in 3 months to discuss results for labs, recheck her liver functions, and discuss further options. She feels her elevated LFTs were most likely due to the methotrexate and the liver damage could potentially do to methotrexate and/or her history of aggressive chemotherapy.  Spent 15-20 mins with this patient with >50% of the time in counseling and educating the patient on the meanings of her results and the options available.

## 2014-07-10 NOTE — Progress Notes (Signed)
Referring Provider: Lavonia Dana, MD Primary Care Physician:  Lavonia Dana, MD Primary GI:  Dr. Gala Romney  Chief Complaint  Patient presents with  . Follow-up    HPI:   59 year old female presents for follow-up visit after endoscopy, colonoscopy, abdominal ultrasound, an abdominal MRI. EGD done 05/29/2014 with normal-appearing esophagus, stomach empty, 3 cm hiatal hernia, linear antral erosions, no ulcer and taking process, normal first and second portion of duodenum. Recommend continue Prilosec 20 mg twice daily, gallbladder ultrasound. 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 Recommend repeat colonoscopy surveillance in 5 years. Stomach biopsy showed mild chronic gastritis negative for H. pylori, benign fundic gland polyp. Abdominal ultrasound done 06/20/2014 showed abnormal liver which was diffusely coarsened with heterogenous echotexture more than what is typically seen with hepatic steatosis. Follow-up MRI done 07/03/2014 showed abnormal appearance of the liver most likely due to chronic hepatic fibrosis which is unchanged from the PET CT scan on 02/24/2011. No definitive changes of cirrhosis or portal venous hypertension. Follow-up liver function test showed normal hepatic function.  Today she states her GERD symptoms are much improved on the bid Prilosec. Denies abdominal pain, improved belching "nothing like it was." Denies yellowing skin or eyes, abdominal swelling, LE edema, hematochezia, melena, chest pain, dyspnea, unintentional weight loss, fever, chills. Denies any further upper or lower GI symptoms.  Past Medical History  Diagnosis Date  . Adenomatous polyp of colon 11/2009    recommended repeat in 5 years  . GERD (gastroesophageal reflux disease)   . Hiatal hernia   . Diverticulosis   .  Diabetes type 2, controlled   . Hypercholesteremia   . HTN (hypertension)   . Breast CA 2010 (L) and 2012 (R)    Bilateral  . Chemotherapy-induced neuropathy     bilateral hands and feet  . OA (osteoarthritis)     feets, hands, knees, back.  . Gastric ulcer   . Duodenal ulcer   . Melanoma   . Complication of anesthesia   . PONV (postoperative nausea and vomiting)     Past Surgical History  Procedure Laterality Date  . Colonoscopy  11/2009    1 descending colon adenomatous polyp; repeat in 5 years  . Breast lumpectomy Bilateral 2010 (L) & 2012 (R)  . Superficial lymph node biopsy / excision Bilateral     Bilateral breast associated  . Incontinence surgery    . Knee arthroscopy    . Esophagogastroduodenoscopy endoscopy  2001    Had stomach polyps (per patient); unable to find procedure notes.  . Colonoscopy N/A 05/29/2014    ALP:[id number] coli/colonic diverticulosis  . Esophagogastroduodenoscopy N/A 05/29/2014    DZH:[id number] erosions s/p bx    Current Outpatient Prescriptions  Medication Sig Dispense Refill  . atenolol (TENORMIN) 25 MG tablet Take by mouth daily.    . calcium-vitamin D (OSCAL WITH D) 500-200 MG-UNIT per tablet Take 2 tablets by mouth.    Marland Kitchen exemestane (AROMASIN) 25 MG tablet TAKE 1 TABLET BY MOUTH EVERY DAY    . gabapentin (NEURONTIN) 800 MG tablet TAKE 1 TABLET BY MOUTH AT BEDTIME    . losartan (COZAAR) 50 MG tablet Take 50 mg by mouth.    . meloxicam (MOBIC) 15 MG tablet Take 15 mg by mouth.    . metFORMIN (GLUCOPHAGE) 1000 MG tablet Take 1,000 mg by mouth 2 (two) times daily  with a meal.    . Multiple Vitamins-Minerals (CENTRUM SILVER ADULT 50+ PO) Take 1 tablet by mouth.    Marland Kitchen omeprazole (PRILOSEC) 20 MG capsule Take 1 capsule (20 mg total) by mouth 2 (two) times daily before a meal. 60 capsule 5  . sertraline (ZOLOFT) 50 MG tablet Take 50 mg by mouth daily.  1  . simvastatin (ZOCOR) 20 MG tablet Take 20 mg by mouth.    . traMADol-acetaminophen  (ULTRACET) 37.5-325 MG per tablet Take 1 tablet by mouth.    . peg 3350 powder (MOVIPREP) 100 G SOLR Take 1 kit (200 g total) by mouth as directed. (Patient not taking: Reported on 07/10/2014) 1 kit 0   No current facility-administered medications for this visit.    Allergies as of 07/10/2014 - Review Complete 07/10/2014  Allergen Reaction Noted  . Celebrex [celecoxib]  02/24/2011  . Sulfa antibiotics  02/24/2011    Family History  Problem Relation Age of Onset  . Colon cancer Neg Hx   . Heart disease Mother   . High blood pressure Mother   . Diabetes Mellitus II Mother     History   Social History  . Marital Status: Married    Spouse Name: N/A  . Number of Children: N/A  . Years of Education: N/A   Social History Main Topics  . Smoking status: Never Smoker   . Smokeless tobacco: Not on file  . Alcohol Use: No  . Drug Use: No  . Sexual Activity: Not on file   Other Topics Concern  . None   Social History Narrative    Review of Systems: General: Negative for anorexia, weight loss, fever, chills, fatigue, weakness. Eyes: Negative for vision changes.  ENT: Negative for hoarseness, difficulty swallowing. CV: Negative for chest pain, angina, palpitations, peripheral edema.  Respiratory: Negative for dyspnea at rest, dyspnea on exertion, cough, sputum, wheezing.  GI: See history of present illness. Derm: Negative for rash or itching.  Neuro: Negative for weakness, abnormal sensation, seizure, frequent headaches, memory loss, confusion.  Psych: Negative for anxiety, depression.  Endo: Negative for unusual weight change.  Allergy: Negative for rash or hives.   Physical Exam: BP 133/82 mmHg  Pulse 74  Temp(Src) 98 F (36.7 C)  Ht 5' 4" (1.626 m)  Wt 197 lb (89.359 kg)  BMI 33.80 kg/m2 General:   Alert and oriented. No distress noted. Pleasant and cooperative.  Head:  Normocephalic and atraumatic. Eyes:  Conjuctiva clear without scleral icterus. Neck:  Supple,  without mass or thyromegaly. Lungs:  Clear to auscultation bilaterally. No wheezes, rales, or rhonchi. No distress.  Heart:  S1, S2 present without murmurs, rubs, or gallops. Regular rate and rhythm. Abdomen:  +BS, soft, non-tender and non-distended. No rebound or guarding. No HSM or masses noted. Msk:  Symmetrical without gross deformities. Normal posture. Extremities:  Without edema. Neurologic:  Alert and  oriented x4;  grossly normal neurologically. Skin:  Intact without significant lesions or rashes. Psych:  Alert and cooperative. Normal mood and affect.    07/10/2014 3:08 PM

## 2014-07-10 NOTE — Assessment & Plan Note (Signed)
History of transaminitis approximately August of last year with a CT-PET in 2012 showing abnormal liver a repeat MRI within the past couple weeks showing signs of hepatic fibrosis without sign of cirrhosis or portal hypertension. Last hepatic function testing done approximately to 3 weeks ago were normal. We will recheck Janet Lawson hepatic function panel today and return in 3 months for further evaluation.

## 2014-07-10 NOTE — Patient Instructions (Signed)
1. Have your labs drawn the next couple few days. 2. Since that a prescription to refill her Prilosec for 90 day supply. 3. Return for reevaluation in 3 months we can recheck her liver enzymes and her GERD symptoms.

## 2014-07-11 LAB — HEPATIC FUNCTION PANEL
ALK PHOS: 75 U/L (ref 39–117)
ALT: 14 U/L (ref 0–35)
AST: 21 U/L (ref 0–37)
Albumin: 4 g/dL (ref 3.5–5.2)
BILIRUBIN DIRECT: 0.1 mg/dL (ref 0.0–0.3)
BILIRUBIN TOTAL: 0.4 mg/dL (ref 0.2–1.2)
Indirect Bilirubin: 0.3 mg/dL (ref 0.2–1.2)
TOTAL PROTEIN: 7.2 g/dL (ref 6.0–8.3)

## 2014-07-16 NOTE — Progress Notes (Signed)
CC'ED TO PCP 

## 2014-07-23 ENCOUNTER — Ambulatory Visit: Payer: Medicare Other | Admitting: Nurse Practitioner

## 2014-08-20 ENCOUNTER — Telehealth: Payer: Self-pay

## 2014-08-20 NOTE — Telephone Encounter (Signed)
Please tell the patient we cannot just call in antibiotics. Ask her where she's nursing, how bad, how long, and any associated symptoms (N/V, blood, etc). If suspicious for diverticulitis, we can consider a CT and if that shows it then we can treat.

## 2014-08-20 NOTE — Telephone Encounter (Signed)
Pt called and states that she ate some nuts and now she is having some diverticulitis. States that she is having pain. Pt states she has had it so many times she knows what she did and knows what is going on with the pain. Wants to know if we can call he in some antibiotics.

## 2014-08-20 NOTE — Telephone Encounter (Signed)
Pt called back and she said thanks but she is not spending money on a CT scan

## 2014-08-20 NOTE — Telephone Encounter (Signed)
Called pt and LMOM.  

## 2014-10-09 ENCOUNTER — Encounter: Payer: Self-pay | Admitting: Nurse Practitioner

## 2014-10-09 ENCOUNTER — Ambulatory Visit (INDEPENDENT_AMBULATORY_CARE_PROVIDER_SITE_OTHER): Payer: Medicare Other | Admitting: Nurse Practitioner

## 2014-10-09 VITALS — BP 143/86 | HR 71 | Temp 97.4°F | Ht 64.0 in | Wt 193.2 lb

## 2014-10-09 DIAGNOSIS — K219 Gastro-esophageal reflux disease without esophagitis: Secondary | ICD-10-CM

## 2014-10-09 DIAGNOSIS — R740 Nonspecific elevation of levels of transaminase and lactic acid dehydrogenase [LDH]: Secondary | ICD-10-CM | POA: Diagnosis not present

## 2014-10-09 DIAGNOSIS — R7401 Elevation of levels of liver transaminase levels: Secondary | ICD-10-CM

## 2014-10-09 NOTE — Assessment & Plan Note (Signed)
The patient's GERD symptoms were well controlled on twice a day Prilosec which was increased from once daily Prilosec after her previous EGD which showed erosions. Since her last office visit she attempted to cut back to one Prilosec daily to reduce her pill burden however she was having some break through symptoms. Discussed the reasoning for twice a day dosing which includes erosions despite once daily dosing. She stated understanding and social definitely increase back to 2 Prilosec a day. Return for follow-up in 6 months.

## 2014-10-09 NOTE — Assessment & Plan Note (Signed)
Liver enzymes have normalized. We will recheck her CMP today and have her return in 6 months for follow-up. No red flag/warning signs or symptoms. Of note the patient states she spoke with oncologist who agrees that either methotrexate or chemotherapy was likely etiology behind her elevated liver enzymes her previous MRI which is abnormal is unchanged from 2012.

## 2014-10-09 NOTE — Progress Notes (Signed)
Referring Provider: Lavonia Dana, MD Primary Care Physician:  Lavonia Dana, MD Primary GI:  Dr. Gala Romney  Chief Complaint  Patient presents with  . Abdominal Pain    HPI:   59 year old female presents for follow-up on GERD and abnormal MRI. At last visit she was 1 month post endoscopy wherein her Prilosec was increased 20 mg twice a day and she been doing quite well on this. Also noted transaminitis which trended down and resolved after stopping her methotrexate. Colonoscopy done 05/30/2014 repeat suggested in 5 years (2021.) Her imaging had shown fibrosis without cirrhosis which is unchanged since 2012. At the last visit the patient declined liver biopsy unless there was further elevation of her liver enzymes, which has not happened.  Today she states her GERD is well controlled on twice a day. She tried to back off to once a day to decrease pill burden and was having some breakthrough symptoms. Her EGD showed erosions on once daily prilosec. Educated on purpose behind bid dosing and she states understanding and will go back to bid Prilosec. Denies abdominal pain, N/V, yellowing of the skin and eyes, darkened urine. Denies chest pain, dyspnea, dizziness, lightheadedness, syncope, near syncope. Denies any other upper or lower GI symptoms.   Past Medical History  Diagnosis Date  . Adenomatous polyp of colon 11/2009    recommended repeat in 5 years  . GERD (gastroesophageal reflux disease)   . Hiatal hernia   . Diverticulosis   . Diabetes type 2, controlled   . Hypercholesteremia   . HTN (hypertension)   . Breast CA 2010 (L) and 2012 (R)    Bilateral  . Chemotherapy-induced neuropathy     bilateral hands and feet  . OA (osteoarthritis)     feets, hands, knees, back.  . Gastric ulcer   . Duodenal ulcer   . Melanoma   . Complication of anesthesia   . PONV (postoperative nausea and vomiting)     Past Surgical History  Procedure Laterality Date  . Colonoscopy  11/2009    1 descending colon adenomatous polyp; repeat in 5 years  . Breast lumpectomy Bilateral 2010 (L) & 2012 (R)  . Superficial lymph node biopsy / excision Bilateral     Bilateral breast associated  . Incontinence surgery    . Knee arthroscopy    . Esophagogastroduodenoscopy endoscopy  2001    Had stomach polyps (per patient); unable to find procedure notes.  . Colonoscopy N/A 05/29/2014    [id number] coli/colonic diverticulosis  . Esophagogastroduodenoscopy N/A 05/29/2014    OIN:[id number]/[id number] erosions s/p bx    Current Outpatient Prescriptions  Medication Sig Dispense Refill  . atenolol (TENORMIN) 25 MG tablet Take by mouth daily.    . calcium-vitamin D (OSCAL WITH D) 500-200 MG-UNIT per tablet Take 2 tablets by mouth.    Marland Kitchen exemestane (AROMASIN) 25 MG tablet TAKE 1 TABLET BY MOUTH EVERY DAY    . gabapentin (NEURONTIN) 800 MG tablet TAKE 1 TABLET BY MOUTH AT BEDTIME    . losartan (COZAAR) 50 MG tablet Take 50 mg by mouth.    . meloxicam (MOBIC) 15 MG tablet Take 15 mg by mouth.    . metFORMIN (GLUCOPHAGE) 1000 MG tablet Take 1,000 mg by mouth 2 (two) times daily with a meal.    . Multiple Vitamins-Minerals (CENTRUM SILVER ADULT 50+ PO) Take 1 tablet by mouth.    Marland Kitchen omeprazole (PRILOSEC) 20 MG capsule Take 20 mg by mouth daily.    . sertraline (  ZOLOFT) 50 MG tablet Take 50 mg by mouth daily.  1  . simvastatin (ZOCOR) 20 MG tablet Take 20 mg by mouth.    . traMADol-acetaminophen (ULTRACET) 37.5-325 MG per tablet Take 1 tablet by mouth.    Marland Kitchen omeprazole (PRILOSEC) 20 MG capsule Take 1 capsule (20 mg total) by mouth 2 (two) times daily before a meal. (Patient not taking: Reported on 10/09/2014) 180 capsule 3  . peg 3350 powder (MOVIPREP) 100 G SOLR Take 1 kit (200 g total) by mouth as directed. (Patient not taking: Reported on 10/09/2014) 1 kit 0   No current facility-administered medications for this visit.    Allergies as of 10/09/2014 - Review Complete 07/10/2014  Allergen Reaction  Noted  . Celebrex [celecoxib]  02/24/2011  . Sulfa antibiotics  02/24/2011    Family History  Problem Relation Age of Onset  . Colon cancer Neg Hx   . Heart disease Mother   . High blood pressure Mother   . Diabetes Mellitus II Mother     History   Social History  . Marital Status: Married    Spouse Name: N/A  . Number of Children: N/A  . Years of Education: N/A   Social History Main Topics  . Smoking status: Never Smoker   . Smokeless tobacco: Not on file  . Alcohol Use: No  . Drug Use: No  . Sexual Activity: Not on file   Other Topics Concern  . None   Social History Narrative    Review of Systems: General: Negative for anorexia, weight loss, fever, chills, fatigue, weakness. CV: Negative for chest pain, angina, palpitations, dyspnea on exertion, peripheral edema.  Respiratory: Negative for dyspnea at rest, dyspnea on exertion, cough, sputum, wheezing.  GI: See history of present illness. Endo: Negative for unusual weight change.  Heme: Negative for bruising or bleeding.  Physical Exam: BP 143/86 mmHg  Pulse 71  Temp(Src) 97.4 F (36.3 C) (Oral)  Ht 5' 4" (1.626 m)  Wt 193 lb 3.2 oz (87.635 kg)  BMI 33.15 kg/m2 General:   Alert and oriented. Pleasant and cooperative. Well-nourished and well-developed.  Head:  Normocephalic and atraumatic. Eyes:  Without icterus, sclera clear and conjunctiva pink.  Ears:  Normal auditory acuity. Cardiovascular:  S1, S2 present without murmurs appreciated. Normal pulses noted. Extremities without clubbing or edema. Respiratory:  Clear to auscultation bilaterally. No wheezes, rales, or rhonchi. No distress.  Gastrointestinal:  +BS, soft, non-tender and non-distended. No HSM noted. No guarding or rebound. No masses appreciated.  Rectal:  Deferred  Neurologic:  Alert and oriented x4;  grossly normal neurologically. Psych:  Alert and cooperative. Normal mood and affect. Heme/Lymph/Immune: No excessive bruising  noted.    10/09/2014 2:11 PM

## 2014-10-09 NOTE — Patient Instructions (Signed)
1. Have your lab work completed when you're able. 2. As we discussed, go back to Prilosec twice a day. 3. Return for follow-up in 6 months.

## 2014-10-10 LAB — COMPREHENSIVE METABOLIC PANEL
ALT: 13 U/L (ref 6–29)
AST: 17 U/L (ref 10–35)
Albumin: 3.9 g/dL (ref 3.6–5.1)
Alkaline Phosphatase: 67 U/L (ref 33–130)
BUN: 13 mg/dL (ref 7–25)
CALCIUM: 9.1 mg/dL (ref 8.6–10.4)
CO2: 26 mEq/L (ref 20–31)
Chloride: 102 mEq/L (ref 98–110)
Creat: 0.72 mg/dL (ref 0.50–0.99)
GLUCOSE: 176 mg/dL — AB (ref 65–99)
POTASSIUM: 4.3 meq/L (ref 3.5–5.3)
SODIUM: 140 meq/L (ref 135–146)
Total Bilirubin: 0.4 mg/dL (ref 0.2–1.2)
Total Protein: 6.5 g/dL (ref 6.1–8.1)

## 2014-10-14 NOTE — Progress Notes (Signed)
CC'ED TO PCP 

## 2015-04-14 ENCOUNTER — Encounter: Payer: Self-pay | Admitting: Nurse Practitioner

## 2015-04-14 ENCOUNTER — Ambulatory Visit (INDEPENDENT_AMBULATORY_CARE_PROVIDER_SITE_OTHER): Payer: Medicare Other | Admitting: Nurse Practitioner

## 2015-04-14 VITALS — BP 140/71 | HR 51 | Temp 97.2°F | Ht 64.0 in | Wt 196.0 lb

## 2015-04-14 DIAGNOSIS — K219 Gastro-esophageal reflux disease without esophagitis: Secondary | ICD-10-CM | POA: Diagnosis not present

## 2015-04-14 MED ORDER — SUCRALFATE 1 GM/10ML PO SUSP: 1.0000 g | Freq: Three times a day (TID) | ORAL | Status: DC

## 2015-04-14 MED ORDER — PANTOPRAZOLE SODIUM 40 MG PO TBEC: 40.0000 mg | DELAYED_RELEASE_TABLET | Freq: Every day | ORAL | Status: DC

## 2015-04-14 NOTE — Patient Instructions (Signed)
1. Stop taking Prilosec 2. Start Protonix 40 mg once daily, 30 minutes before your first meal of the day 3. Take Carafate up to 4 times a day as needed for breakthorugh symptoms 4. Call if the liquid Carafate is too expensive. 5. Call with response to Protonix in 2 weeks 6. Return for follow-up in 6-8 weeks.

## 2015-04-14 NOTE — Progress Notes (Signed)
Referring Provider: Lavonia Dana, MD Primary Care Physician:  Lavonia Dana, MD Primary GI:  Dr. Gala Romney  Chief Complaint  Patient presents with  . Follow-up    alot of indigestion the past 2 weeks and has a bitter taste in her mouth    HPI:   60 year old female presents for follow-up on GERD symptoms. Last seen in our office 10/09/2014 which point she stated twice a day Prilosec and worked well, she decrease to once a day to diminish her pill burden however her breakthrough symptoms worsened. After the visit recommended she go back to twice a day dosing. Asked EGD completed on 05/29/2014 which found normal esophagus, hiatal hernia, gastric polyps, gastric erosions status post biopsy with biopsy showing mild gastritis without H. pylori. Recommended continue Prilosec 20 mg twice daily, gallbladder ultrasound was scheduled. No gallstones or wall thickening visualized, no sonographic Murphy sign. Common bile duct diameter 2.9 mm. Liver consistent with hepatic steatosis. Follow-up MRI showed abnormal appearance to the liver most likely due to chronic hepatic fibrosis also present on previous PET scan dated 02/24/2011. Radiology states he did not see any definite changes of cirrhosis or portal venous hypertension. No concerning lesions noted.  Today she states things have been going well. About 2 weeks ago ate something (she's not sure what) and had GERD flare. Took Alka-Seltzer chews which helped. However, since then has had recurrent GERD symptoms. Is having symptoms only at night, after laying down. Typically eats 6:30 pm and lays down no earlier than 11 pm. Also with bitter taste in her mouth. Also with excessive belching and bloating feeling. Is taking Prilosec bid. Has never taken another PPI. Denies hematochezia, melena, fever, chills, changes in bowel habits. Has had N/V since starting psoriasis medication with a likely ADE of nausea, since stopping the medication her N/V has nearly  resolved. Is also having sinus drainage issues. Has an appointment upcoming with the dentist. Denies chest pain, dyspnea, dizziness, lightheadedness, syncope, near syncope. Denies any other upper or lower GI symptoms.  Past Medical History  Diagnosis Date  . Adenomatous polyp of colon 11/2009    recommended repeat in 5 years  . GERD (gastroesophageal reflux disease)   . Hiatal hernia   . Diverticulosis   . Diabetes type 2, controlled ([place])   . Hypercholesteremia   . HTN (hypertension)   . Breast CA ([place]) 2010 (L) and 2012 (R)    Bilateral  . Chemotherapy-induced neuropathy ([place])     bilateral hands and feet  . OA (osteoarthritis)     feets, hands, knees, back.  . Gastric ulcer   . Duodenal ulcer   . Melanoma ([place])   . Complication of anesthesia   . PONV (postoperative nausea and vomiting)     Past Surgical History  Procedure Laterality Date  . Colonoscopy  11/2009    1 descending colon adenomatous polyp; repeat in 5 years  . Breast lumpectomy Bilateral 2010 (L) & 2012 (R)  . Superficial lymph node biopsy / excision Bilateral     Bilateral breast associated  . Incontinence surgery    . Knee arthroscopy    . Esophagogastroduodenoscopy endoscopy  2001    Had stomach polyps (per patient); unable to find procedure notes.  . Colonoscopy N/A 05/29/2014    [id number] coli/colonic diverticulosis  . Esophagogastroduodenoscopy N/A 05/29/2014    [id number] erosions s/p bx    Current Outpatient Prescriptions  Medication Sig Dispense Refill  . atenolol (TENORMIN) 25 MG tablet Take by  mouth daily.    . calcium-vitamin D (OSCAL WITH D) 500-200 MG-UNIT per tablet Take 2 tablets by mouth.    Marland Kitchen exemestane (AROMASIN) 25 MG tablet Reported on 04/14/2015    . gabapentin (NEURONTIN) 800 MG tablet TAKE 1 TABLET BY MOUTH AT BEDTIME    . losartan (COZAAR) 50 MG tablet Take 50 mg by mouth.    . meloxicam (MOBIC) 15 MG tablet Take 15 mg by mouth.    . metFORMIN (GLUCOPHAGE) 1000 MG  tablet Take 1,000 mg by mouth 2 (two) times daily with a meal.    . omeprazole (PRILOSEC) 20 MG capsule Take 1 capsule (20 mg total) by mouth 2 (two) times daily before a meal. 180 capsule 3  . sertraline (ZOLOFT) 50 MG tablet Take 50 mg by mouth daily.  1  . simvastatin (ZOCOR) 20 MG tablet Take 20 mg by mouth.    . traMADol-acetaminophen (ULTRACET) 37.5-325 MG per tablet Take 1 tablet by mouth.    . Multiple Vitamins-Minerals (CENTRUM SILVER ADULT 50+ PO) Take 1 tablet by mouth. Reported on 04/14/2015     No current facility-administered medications for this visit.    Allergies as of 04/14/2015 - Review Complete 04/14/2015  Allergen Reaction Noted  . Celebrex [celecoxib]  02/24/2011  . Sulfa antibiotics  02/24/2011    Family History  Problem Relation Age of Onset  . Colon cancer Neg Hx   . Heart disease Mother   . High blood pressure Mother   . Diabetes Mellitus II Mother     Social History   Social History  . Marital Status: Married    Spouse Name: N/A  . Number of Children: N/A  . Years of Education: N/A   Social History Main Topics  . Smoking status: Never Smoker   . Smokeless tobacco: None  . Alcohol Use: No  . Drug Use: No  . Sexual Activity: Not Asked   Other Topics Concern  . None   Social History Narrative    Review of Systems: General: Negative for anorexia, weight loss, fever, chills, fatigue, weakness. ENT: Negative for hoarseness, difficulty swallowing. CV: Negative for chest pain, angina, palpitations, peripheral edema.  Respiratory: Negative for dyspnea at rest, cough, sputum, wheezing.  GI: See history of present illness. Derm: Admits history of psoriasis.  Endo: Negative for unusual weight change.  Heme: Negative for bruising or bleeding. Allergy: Negative for rash or hives.   Physical Exam: BP 140/71 mmHg  Pulse 51  Temp(Src) 97.2 F (36.2 C) (Oral)  Ht 5\' 4"  (1.626 m)  Wt 196 lb (88.905 kg)  BMI 33.63 kg/m2 General:   Alert and  oriented. Pleasant and cooperative. Well-nourished and well-developed.  Head:  Normocephalic and atraumatic. Eyes:  Without icterus, sclera clear and conjunctiva pink.  Ears:  Normal auditory acuity. Cardiovascular:  S1, S2 present without murmurs appreciated. Extremities without clubbing or edema. Respiratory:  Clear to auscultation bilaterally. No wheezes, rales, or rhonchi. No distress.  Gastrointestinal:  +BS, rounded but soft, non-tender and non-distended. No HSM noted. No guarding or rebound. No masses appreciated.  Rectal:  Deferred  Musculoskalatal:  Symmetrical without gross deformities. Skin:  Intact without significant lesions or rashes. Neurologic:  Alert and oriented x4;  grossly normal neurologically. Psych:  Alert and cooperative. Normal mood and affect.    04/14/2015 1:35 PM

## 2015-04-14 NOTE — Progress Notes (Signed)
CC'D TO PCP °

## 2015-04-14 NOTE — Assessment & Plan Note (Signed)
Patient with worsening GERD symptoms on prilosec which was previously controlling her symptoms. At this point she may be developing a tolerance to Prilosec. Will have her stop bid Prilosec and change her to Protonix 40 mg daily. Also Carafate qid prn for breakthrough symptoms. She is to call with response to Protonix after 2 weeks. Return for follow-up in 2 months.

## 2015-06-10 ENCOUNTER — Ambulatory Visit: Payer: Medicare Other | Admitting: Nurse Practitioner

## 2015-06-11 ENCOUNTER — Other Ambulatory Visit: Payer: Self-pay | Admitting: Nurse Practitioner

## 2015-07-23 ENCOUNTER — Other Ambulatory Visit: Payer: Self-pay | Admitting: Nurse Practitioner

## 2015-07-23 NOTE — Telephone Encounter (Signed)
Please verify which PPI patient is taking. According to January 2017 note, omeprazole was switched to Protonix.

## 2015-07-29 ENCOUNTER — Ambulatory Visit (INDEPENDENT_AMBULATORY_CARE_PROVIDER_SITE_OTHER): Payer: Medicare Other | Admitting: Nurse Practitioner

## 2015-07-29 ENCOUNTER — Encounter: Payer: Self-pay | Admitting: Nurse Practitioner

## 2015-07-29 VITALS — BP 133/80 | HR 53 | Temp 98.7°F | Ht 62.0 in | Wt 198.6 lb

## 2015-07-29 DIAGNOSIS — K219 Gastro-esophageal reflux disease without esophagitis: Secondary | ICD-10-CM

## 2015-07-29 MED ORDER — PANTOPRAZOLE SODIUM 40 MG PO TBEC: 40.0000 mg | DELAYED_RELEASE_TABLET | Freq: Every day | ORAL | Status: DC

## 2015-07-29 NOTE — Patient Instructions (Signed)
1. Keep taking Protonix daily. 2. I have sent in a refill. 3. Return for follow-up in 1 year 4. Call us if you have any worsening of your symptoms and we can bring you back in sooner.

## 2015-07-29 NOTE — Progress Notes (Signed)
Referring Provider: Lavonia Dana, MD Primary Care Physician:  Lavonia Dana, MD Primary GI:  Dr. Gala Romney  Chief Complaint  Patient presents with  . Gastroesophageal Reflux    HPI:   Janet Lawson is a 60 y.o. female who presents for follow-up on GERD. She was last seen in our office 04/14/2015 with noted worsening GERD symptoms on chronic Prilosec, likely developing intolerance. Changed her to Protonix 40 mg a day and Carafate 4 times a day when necessary for breakthrough symptoms. She was asked to call us with progress report in 2 weeks which she did not do. Recommended return for follow-up in 2 months which was postponed by the patient to today.  Today she states she's doing much better on Protonix. Rare breakthrough symptoms which she admits occurs if she eats too late or too much. Denies abdominal pain, N/V, hematochezia, melena, fever, chills, unintentional weight loss. Denies chest pain, dyspnea, dizziness, lightheadedness, syncope, near syncope. Denies any other upper or lower GI symptoms.   Past Medical History  Diagnosis Date  . Adenomatous polyp of colon 11/2009    recommended repeat in 5 years  . GERD (gastroesophageal reflux disease)   . Hiatal hernia   . Diverticulosis   . Diabetes type 2, controlled ([place])   . Hypercholesteremia   . HTN (hypertension)   . Breast CA ([place]) 2010 (L) and 2012 (R)    Bilateral  . Chemotherapy-induced neuropathy ([place])     bilateral hands and feet  . OA (osteoarthritis)     feets, hands, knees, back.  . Gastric ulcer   . Duodenal ulcer   . Melanoma ([place])   . Complication of anesthesia   . PONV (postoperative nausea and vomiting)     Past Surgical History  Procedure Laterality Date  . Colonoscopy  11/2009    1 descending colon adenomatous polyp; repeat in 5 years  . Breast lumpectomy Bilateral 2010 (L) & 2012 (R)  . Superficial lymph node biopsy / excision Bilateral     Bilateral breast associated  . Incontinence  surgery    . Knee arthroscopy    . Esophagogastroduodenoscopy endoscopy  2001    Had stomach polyps (per patient); unable to find procedure notes.  . Colonoscopy N/A 05/29/2014    [id number] coli/colonic diverticulosis  . Esophagogastroduodenoscopy N/A 05/29/2014    [id number] erosions s/p bx    Current Outpatient Prescriptions  Medication Sig Dispense Refill  . atenolol (TENORMIN) 25 MG tablet Take by mouth daily.    . calcium-vitamin D (OSCAL WITH D) 500-200 MG-UNIT per tablet Take 2 tablets by mouth.    Marland Kitchen exemestane (AROMASIN) 25 MG tablet Reported on 04/14/2015    . gabapentin (NEURONTIN) 800 MG tablet TAKE 1 TABLET BY MOUTH AT BEDTIME    . losartan (COZAAR) 50 MG tablet Take 50 mg by mouth.    . meloxicam (MOBIC) 15 MG tablet Take 15 mg by mouth.    . metFORMIN (GLUCOPHAGE) 1000 MG tablet Take 1,000 mg by mouth 2 (two) times daily with a meal.    . pantoprazole (PROTONIX) 40 MG tablet TAKE 1 TABLET BY MOUTH EVERY DAY 30 tablet 11  . sertraline (ZOLOFT) 50 MG tablet Take 50 mg by mouth daily.  1  . simvastatin (ZOCOR) 20 MG tablet Take 20 mg by mouth.    . sucralfate (CARAFATE) 1 GM/10ML suspension Take 10 mLs (1 g total) by mouth 4 (four) times daily -  with meals and at bedtime. As needed  420 mL 1  . traMADol-acetaminophen (ULTRACET) 37.5-325 MG per tablet Take 1 tablet by mouth.    Marland Kitchen omeprazole (PRILOSEC) 20 MG capsule Take 1 capsule (20 mg total) by mouth 2 (two) times daily before a meal. (Patient not taking: Reported on 07/29/2015) 180 capsule 3   No current facility-administered medications for this visit.    Allergies as of 07/29/2015 - Review Complete 07/29/2015  Allergen Reaction Noted  . Celebrex [celecoxib]  02/24/2011  . Sulfa antibiotics  02/24/2011    Family History  Problem Relation Age of Onset  . Colon cancer Neg Hx   . Heart disease Mother   . High blood pressure Mother   . Diabetes Mellitus II Mother     Social History   Social History    . Marital Status: Married    Spouse Name: N/A  . Number of Children: N/A  . Years of Education: N/A   Social History Main Topics  . Smoking status: Never Smoker   . Smokeless tobacco: None  . Alcohol Use: No  . Drug Use: No  . Sexual Activity: Not Asked   Other Topics Concern  . None   Social History Narrative    Review of Systems: General: Negative for anorexia, weight loss, fever, chills, fatigue, weakness. ENT: Negative for hoarseness, difficulty swallowing. CV: Negative for chest pain, angina, palpitations, peripheral edema.  Respiratory: Negative for dyspnea at rest, cough, sputum, wheezing.  GI: See history of present illness. Endo: Negative for unusual weight change.    Physical Exam: BP 133/80 mmHg  Pulse 53  Temp(Src) 98.7 F (37.1 C) (Oral)  Ht 5\' 2"  (1.575 m)  Wt 198 lb 9.6 oz (90.084 kg)  BMI 36.32 kg/m2 General:   Alert and oriented. Pleasant and cooperative. Well-nourished and well-developed.  Eyes:  Without icterus, sclera clear and conjunctiva pink.  Ears:  Normal auditory acuity. Cardiovascular:  S1, S2 present without murmurs appreciated. Extremities without clubbing or edema. Respiratory:  Clear to auscultation bilaterally. No wheezes, rales, or rhonchi. No distress.  Gastrointestinal:  +BS, rounded but soft, non-tender and non-distended. No HSM noted. No guarding or rebound. No masses appreciated.  Rectal:  Deferred  Neurologic:  Alert and oriented x4;  grossly normal neurologically. Psych:  Alert and cooperative. Normal mood and affect. Heme/Lymph/Immune: No excessive bruising noted.    07/29/2015 10:48 AM   Disclaimer: This note was dictated with voice recognition software. Similar sounding words can inadvertently be transcribed and may not be corrected upon review.

## 2015-07-29 NOTE — Progress Notes (Signed)
CC'ED TO PCP 

## 2015-07-29 NOTE — Assessment & Plan Note (Signed)
Symptoms very well controlled pn Protonix. Requesting 90 days supply with refills, will send that in. Return for follow-up in 1 year or sonner if worsening symptoms.

## 2016-04-07 ENCOUNTER — Ambulatory Visit: Payer: Medicare Other | Admitting: Nurse Practitioner

## 2016-05-05 ENCOUNTER — Encounter: Payer: Self-pay | Admitting: Nurse Practitioner

## 2016-05-05 ENCOUNTER — Ambulatory Visit (INDEPENDENT_AMBULATORY_CARE_PROVIDER_SITE_OTHER): Payer: Medicare Other | Admitting: Nurse Practitioner

## 2016-05-05 ENCOUNTER — Other Ambulatory Visit: Payer: Self-pay

## 2016-05-05 VITALS — BP 110/70 | HR 75 | Temp 98.3°F | Ht 64.0 in | Wt 191.6 lb

## 2016-05-05 DIAGNOSIS — R935 Abnormal findings on diagnostic imaging of other abdominal regions, including retroperitoneum: Secondary | ICD-10-CM | POA: Diagnosis not present

## 2016-05-05 DIAGNOSIS — R740 Nonspecific elevation of levels of transaminase and lactic acid dehydrogenase [LDH]: Secondary | ICD-10-CM | POA: Diagnosis not present

## 2016-05-05 DIAGNOSIS — K219 Gastro-esophageal reflux disease without esophagitis: Secondary | ICD-10-CM

## 2016-05-05 DIAGNOSIS — R7401 Elevation of levels of liver transaminase levels: Secondary | ICD-10-CM

## 2016-05-05 DIAGNOSIS — R945 Abnormal results of liver function studies: Secondary | ICD-10-CM

## 2016-05-05 DIAGNOSIS — R932 Abnormal findings on diagnostic imaging of liver and biliary tract: Secondary | ICD-10-CM

## 2016-05-05 DIAGNOSIS — R7989 Other specified abnormal findings of blood chemistry: Secondary | ICD-10-CM

## 2016-05-05 LAB — CBC WITH DIFFERENTIAL/PLATELET
BASOS PCT: 1 %
Basophils Absolute: 62 cells/uL (ref 0–200)
EOS ABS: 124 {cells}/uL (ref 15–500)
Eosinophils Relative: 2 %
HEMATOCRIT: 36 % (ref 35.0–45.0)
Hemoglobin: 11.3 g/dL — ABNORMAL LOW (ref 11.7–15.5)
LYMPHS ABS: 1550 {cells}/uL (ref 850–3900)
Lymphocytes Relative: 25 %
MCH: 25.9 pg — ABNORMAL LOW (ref 27.0–33.0)
MCHC: 31.4 g/dL — ABNORMAL LOW (ref 32.0–36.0)
MCV: 82.4 fL (ref 80.0–100.0)
MONO ABS: 558 {cells}/uL (ref 200–950)
MPV: 9.9 fL (ref 7.5–12.5)
Monocytes Relative: 9 %
NEUTROS ABS: 3906 {cells}/uL (ref 1500–7800)
Neutrophils Relative %: 63 %
PLATELETS: 211 10*3/uL (ref 140–400)
RBC: 4.37 MIL/uL (ref 3.80–5.10)
RDW: 14.7 % (ref 11.0–15.0)
WBC: 6.2 10*3/uL (ref 3.8–10.8)

## 2016-05-05 NOTE — Patient Instructions (Signed)
1. Have your labs drawn when you're able to. 2. We will help schedule your liver biopsy for you. 3. Return for follow-up in 3 months. 4. Call us if you have any questions or concerns.

## 2016-05-05 NOTE — Assessment & Plan Note (Signed)
Currently well-controlled on Protonix. Continue to monitor. Return for follow-up as needed.

## 2016-05-05 NOTE — Progress Notes (Signed)
Referring Provider: Lavonia Dana, MD Primary Care Physician:  PROVIDER NOT IN SYSTEM Primary GI:  Dr. Gala Romney  Chief Complaint  Patient presents with  . Advice Only    liver biopsy    HPI:   Janet Lawson is a 61 y.o. female who presents to discuss liver biopsy. The patient was last seen in our office 07/29/2015 for GERD. At her last visit she was doing much better on Protonix with rare breakthrough symptoms. She has a history of transaminitis and was seen for this on 10/09/2014 as follow-up for abnormal MRI. Her transaminitis trended down resolved after stopping methotrexate. Imaging showed fibrosis without cirrhosis which was unchanged since 2012. MRI had been completed 07/03/2014 which noted abnormal appearance of the liver most likely due to chronic hepatic fibrosis which is also present on the PET scan in 2012, no definite changes of cirrhosis or portal hypertension. Recommended liver biopsy to help with diagnostic purposes if she has elevated liver function abnormalities. No worrisome hepatic lesions. No abdominal mass or adenopathy.   Initially she declines liver biopsy. Today she presents to discuss possible liver biopsy. Her last CMP was completed 10/09/2014 with normal AST/ALT, alkaline phosphatase, bilirubin, albumin.   Today she states she's doing well. GERD doing great on Protonix. She has a new PCP as of 03/16/16 due to previous PCP left. Her new PCP mentioned that she should have the liver biopsy done.   Past Medical History:  Diagnosis Date  . Adenomatous polyp of colon 11/2009   recommended repeat in 5 years  . Breast CA ([place]) 2010 (L) and 2012 (R)   Bilateral  . 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 Complication of anesthesia   . Diabetes type 2, controlled ([place])   . Diverticulosis   . Duodenal ulcer   . Gastric ulcer   . GERD (gastroesophageal reflux disease)   . Hiatal hernia   . HTN (hypertension)   . Hypercholesteremia   .  Melanoma ([place])   . OA (osteoarthritis)    feets, hands, knees, back.  Marland Kitchen PONV (postoperative nausea and vomiting)     Past Surgical History:  Procedure Laterality Date  . BREAST LUMPECTOMY Bilateral 2010 (L) & 2012 (R)  . COLONOSCOPY  11/2009   1 descending colon adenomatous polyp; repeat in 5 years  . COLONOSCOPY N/A 05/29/2014   [id number] coli/colonic diverticulosis  . ESOPHAGOGASTRODUODENOSCOPY N/A 05/29/2014   [id number] erosions s/p bx  . ESOPHAGOGASTRODUODENOSCOPY ENDOSCOPY  2001   Had stomach polyps (per patient); unable to find procedure notes.  . INCONTINENCE SURGERY    . KNEE ARTHROSCOPY    . SUPERFICIAL LYMPH NODE BIOPSY / EXCISION Bilateral    Bilateral breast associated    Current Outpatient Prescriptions  Medication Sig Dispense Refill  . aspirin EC 81 MG tablet Take 81 mg by mouth daily.    Marland Kitchen atenolol (TENORMIN) 25 MG tablet Take by mouth daily.    . calcium-vitamin D (OSCAL WITH D) 500-200 MG-UNIT per tablet Take 2 tablets by mouth.    . co-enzyme Q-10 50 MG capsule Take 100 mg by mouth daily.    Marland Kitchen gabapentin (NEURONTIN) 800 MG tablet 400 mg 3 (three) times daily. TAKE 1 TABLET BY MOUTH AT BEDTIME    . losartan (COZAAR) 50 MG tablet Take 50 mg by mouth.    . magnesium oxide (MAG-OX) 400 MG tablet Take 400 mg by mouth daily.    . Melatonin 5 MG TABS Take  5 mg by mouth daily.    . meloxicam (MOBIC) 15 MG tablet Take 15 mg by mouth.    . metFORMIN (GLUCOPHAGE) 1000 MG tablet Take 1,000 mg by mouth 2 (two) times daily with a meal.    . pantoprazole (PROTONIX) 40 MG tablet Take 1 tablet (40 mg total) by mouth daily. 90 tablet 3  . pioglitazone (ACTOS) 15 MG tablet Take 15 mg by mouth daily.    . sertraline (ZOLOFT) 50 MG tablet Take 50 mg by mouth daily.  1  . simvastatin (ZOCOR) 20 MG tablet Take 20 mg by mouth.     No current facility-administered medications for this visit.     Allergies as of 05/05/2016 - Review Complete 05/05/2016  Allergen  Reaction Noted  . Celebrex [celecoxib]  02/24/2011  . Sulfa antibiotics  02/24/2011    Family History  Problem Relation Age of Onset  . Heart disease Mother   . High blood pressure Mother   . Diabetes Mellitus II Mother   . Colon cancer Neg Hx     Social History   Social History  . Marital status: Married    Spouse name: N/A  . Number of children: N/A  . Years of education: N/A   Social History Main Topics  . Smoking status: Never Smoker  . Smokeless tobacco: Never Used  . Alcohol use No  . Drug use: No  . Sexual activity: Not Asked   Other Topics Concern  . None   Social History Narrative  . None    Review of Systems: General: Negative for anorexia, weight loss, fever, chills, fatigue, weakness. ENT: Negative for hoarseness, difficulty swallowing. CV: Negative for chest pain, angina, palpitations, peripheral edema.  Respiratory: Negative for dyspnea at rest, cough, sputum, wheezing.  GI: See history of present illness. Endo: Negative for unusual weight change.  Heme: Negative for bruising or bleeding.  Physical Exam: BP 110/70   Pulse 75   Temp 98.3 F (36.8 C) (Oral)   Ht 5\' 4"  (1.626 m)   Wt 191 lb 9.6 oz (86.9 kg)   BMI 32.89 kg/m  General:   Alert and oriented. Pleasant and cooperative. Well-nourished and well-developed.  Head:  Normocephalic and atraumatic. Eyes:  Without icterus, sclera clear and conjunctiva pink.  Ears:  Normal auditory acuity. Cardiovascular:  S1, S2 present without murmurs appreciated. Extremities without clubbing or edema. Respiratory:  Clear to auscultation bilaterally. No wheezes, rales, or rhonchi. No distress.  Gastrointestinal:  +BS, rounded but soft, non-tender and non-distended. No HSM noted. No guarding or rebound. No masses appreciated.  Rectal:  Deferred  Musculoskalatal:  Symmetrical without gross deformities. Neurologic:  Alert and oriented x4;  grossly normal neurologically. Psych:  Alert and cooperative. Normal  mood and affect. Heme/Lymph/Immune: No excessive bruising noted.    05/05/2016 2:56 PM   Disclaimer: This note was dictated with voice recognition software. Similar sounding words can inadvertently be transcribed and may not be corrected upon review.

## 2016-05-05 NOTE — Assessment & Plan Note (Signed)
Transaminitis has resolved as of her last lab draw. I will recheck CMP today as well as CBC, specifically for platelet count and consideration of portal hypertension. Although I doubt she has this. She has also asked about one time screening for hepatitis C based on a CDC ad she saw on the TV. She is in the appropriate range and I will do a one-time screening for hepatitis C antibody. Further management of abnormal liver on MRI imaging as per above. Return for follow-up in 3 months.

## 2016-05-05 NOTE — Assessment & Plan Note (Signed)
Historically has an abnormal MRI of the liver which is completed after abnormal ultrasound of the liver. Imaging suggestive of fibrosis due to unclear etiology. Liver biopsy was recommended but declined at that time. She is now amenable to biopsy. I discussed with her all the risks and typical process/procedure for liver biopsy. She is wanting to proceed with this. We do not do this at [facility] and so she will have to go down [facility]. CBC, CMP, hepatitis C one time screening as per above. Proceed with percutaneous liver biopsy with the patient request for preprocedure sedative. Return for follow-up in 3 months.

## 2016-05-06 LAB — HEPATITIS C ANTIBODY: HCV AB: NEGATIVE

## 2016-05-06 LAB — COMPREHENSIVE METABOLIC PANEL
ALK PHOS: 57 U/L (ref 33–130)
ALT: 13 U/L (ref 6–29)
AST: 16 U/L (ref 10–35)
Albumin: 3.8 g/dL (ref 3.6–5.1)
BILIRUBIN TOTAL: 0.4 mg/dL (ref 0.2–1.2)
BUN: 17 mg/dL (ref 7–25)
CALCIUM: 9 mg/dL (ref 8.6–10.4)
CO2: 23 mmol/L (ref 20–31)
Chloride: 106 mmol/L (ref 98–110)
Creat: 0.82 mg/dL (ref 0.50–0.99)
Glucose, Bld: 139 mg/dL — ABNORMAL HIGH (ref 65–99)
POTASSIUM: 4.5 mmol/L (ref 3.5–5.3)
Sodium: 139 mmol/L (ref 135–146)
TOTAL PROTEIN: 6.6 g/dL (ref 6.1–8.1)

## 2016-05-06 NOTE — Progress Notes (Signed)
CC'ED TO PCP 

## 2016-05-13 ENCOUNTER — Other Ambulatory Visit: Payer: Self-pay | Admitting: Nurse Practitioner

## 2016-05-13 DIAGNOSIS — D649 Anemia, unspecified: Secondary | ICD-10-CM

## 2016-05-19 ENCOUNTER — Other Ambulatory Visit: Payer: Self-pay | Admitting: Radiology

## 2016-05-19 ENCOUNTER — Other Ambulatory Visit: Payer: Self-pay | Admitting: General Surgery

## 2016-05-20 ENCOUNTER — Ambulatory Visit (HOSPITAL_COMMUNITY)
Admission: RE | Admit: 2016-05-20 | Discharge: 2016-05-20 | Disposition: A | Discharge status: Home / Self Care | Payer: Medicare Other | Source: Ambulatory Visit | Attending: Nurse Practitioner | Admitting: Nurse Practitioner

## 2016-05-20 ENCOUNTER — Encounter (HOSPITAL_COMMUNITY): Payer: Self-pay

## 2016-05-20 DIAGNOSIS — Z853 Personal history of malignant neoplasm of breast: Secondary | ICD-10-CM | POA: Diagnosis not present

## 2016-05-20 DIAGNOSIS — R932 Abnormal findings on diagnostic imaging of liver and biliary tract: Secondary | ICD-10-CM

## 2016-05-20 DIAGNOSIS — L4050 Arthropathic psoriasis, unspecified: Secondary | ICD-10-CM | POA: Insufficient documentation

## 2016-05-20 DIAGNOSIS — R945 Abnormal results of liver function studies: Secondary | ICD-10-CM

## 2016-05-20 DIAGNOSIS — K740 Hepatic fibrosis: Secondary | ICD-10-CM | POA: Diagnosis not present

## 2016-05-20 DIAGNOSIS — K760 Fatty (change of) liver, not elsewhere classified: Secondary | ICD-10-CM | POA: Insufficient documentation

## 2016-05-20 DIAGNOSIS — R7989 Other specified abnormal findings of blood chemistry: Secondary | ICD-10-CM | POA: Diagnosis not present

## 2016-05-20 LAB — GLUCOSE, CAPILLARY: Glucose-Capillary: 190 mg/dL — ABNORMAL HIGH (ref 65–99)

## 2016-05-20 LAB — APTT: aPTT: 25 seconds (ref 24–36)

## 2016-05-20 LAB — PROTIME-INR
INR: 1.07
Prothrombin Time: 13.9 seconds (ref 11.4–15.2)

## 2016-05-20 MED ORDER — GELATIN ABSORBABLE 12-7 MM EX MISC
CUTANEOUS | Status: AC
  Filled 2016-05-20: qty 1

## 2016-05-20 MED ORDER — SODIUM CHLORIDE 0.9 % IV SOLN: INTRAVENOUS | Status: DC

## 2016-05-20 MED ORDER — MIDAZOLAM HCL 2 MG/2ML IJ SOLN
INTRAMUSCULAR | Status: AC | PRN
  Administered 2016-05-20: 1 mg via INTRAVENOUS

## 2016-05-20 MED ORDER — FENTANYL CITRATE (PF) 100 MCG/2ML IJ SOLN
INTRAMUSCULAR | Status: AC | PRN
  Administered 2016-05-20: 50 ug via INTRAVENOUS

## 2016-05-20 MED ORDER — SODIUM CHLORIDE 0.9 % IV SOLN
INTRAVENOUS | Status: AC | PRN
  Administered 2016-05-20: 10 mL/h via INTRAVENOUS

## 2016-05-20 MED ORDER — FENTANYL CITRATE (PF) 100 MCG/2ML IJ SOLN
INTRAMUSCULAR | Status: AC
  Filled 2016-05-20: qty 2

## 2016-05-20 MED ORDER — MIDAZOLAM HCL 2 MG/2ML IJ SOLN
INTRAMUSCULAR | Status: AC
  Filled 2016-05-20: qty 2

## 2016-05-20 MED ORDER — LIDOCAINE-EPINEPHRINE 1 %-1:100000 IJ SOLN
INTRAMUSCULAR | Status: AC
  Filled 2016-05-20: qty 1

## 2016-05-20 NOTE — Sedation Documentation (Signed)
Patient is resting comfortably. 

## 2016-05-20 NOTE — Sedation Documentation (Signed)
Patient denies pain and is resting comfortably.  

## 2016-05-20 NOTE — Discharge Instructions (Signed)
Liver Biopsy, Care After  These instructions give you information on caring for yourself after your procedure. Your doctor may also give you more specific instructions. Call your doctor if you have any problems or questions after your procedure.  Follow these instructions at home:  · Rest at home for 1-2 days or as told by your doctor.  · Have someone stay with you for at least 24 hours.  · Do not do these things in the first 24 hours:  ? Drive.  ? Use machinery.  ? Take care of other people.  ? Sign legal documents.  ? Take a bath or shower.  · There are many different ways to close and cover a cut (incision). For example, a cut can be closed with stitches, skin glue, or adhesive strips. Follow your doctor's instructions on:  ? Taking care of your cut.  ? Changing and removing your bandage (dressing).  ? Removing whatever was used to close your cut.  · Do not drink alcohol in the first week.  · Do not lift more than 5 pounds or play contact sports for the first 2 weeks.  · Take medicines only as told by your doctor. For 1 week, do not take medicine that has aspirin in it or medicines like ibuprofen.  · Get your test results.  Contact a doctor if:  · A cut bleeds and leaves more than just a small spot of blood.  · A cut is red, puffs up (swells), or hurts more than before.  · Fluid or something else comes from a cut.  · A cut smells bad.  · You have a fever or chills.  Get help right away if:  · You have swelling, bloating, or pain in your belly (abdomen).  · You get dizzy or faint.  · You have a rash.  · You feel sick to your stomach (nauseous) or throw up (vomit).  · You have trouble breathing, feel short of breath, or feel faint.  · Your chest hurts.  · You have problems talking or seeing.  · You have trouble balancing or moving your arms or legs.  This information is not intended to replace advice given to you by your health care provider. Make sure you discuss any questions you have with your health care  provider.  Document Released: 12/09/2007 Document Revised: 08/07/2015 Document Reviewed: 04/27/2013  Elsevier Interactive Patient Education © 2017 [employer]

## 2016-05-20 NOTE — Sedation Documentation (Signed)
Having 5/10 left neck and shoulder pain which she sees a chiropractor for. States nothing will help the pain. Aware she will be getting some narcotic pre procedure.

## 2016-05-20 NOTE — Sedation Documentation (Signed)
02 d/c 

## 2016-05-20 NOTE — H&P (Signed)
Chief Complaint: abnormal liver appearance on MRI in 2016  Referring Physician:Dr. Herbie Baltimore Rourk/ Walden Field, NP  Supervising Physician: Sandi Mariscal  Patient Status: [facility] - Out-pt  HPI: Janet Lawson is an 61 y.o. female who has a history of breast cancer with chemotherapy and psoriatic arthritis for which she takes methotrexate.  2 years ago she started having abdominal pain with nausea and vomiting.  She was placed on protonix and an [place] was ordered.  The protonix corrected her problem, but her US revealed an abnormal appearance of her liver.  Therefore an MRI was obtained which confirmed an appearance most likely secondary to chronic hepatic fibrosis.  Her LFTs are normal.  This was felt to be secondary to her chemo by her oncologist and her methotrexate by her PCP.  A biopsy was recommended at the time, but the patient refused.  Her PCP is wanting to restarted her methotrexate and before their willingness to do this, a liver biopsy has been requested to confirm pathology in the liver.  The patient has no complaints except pain in her knees from her arthritis.   Past Medical History:  Past Medical History:  Diagnosis Date  . Adenomatous polyp of colon 11/2009   recommended repeat in 5 years  . Breast CA ([place]) 2010 (L) and 2012 (R)   Bilateral  . 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 Complication of anesthesia   . Diabetes type 2, controlled ([place])   . Diverticulosis   . Duodenal ulcer   . Gastric ulcer   . GERD (gastroesophageal reflux disease)   . Hiatal hernia   . HTN (hypertension)   . Hypercholesteremia   . Melanoma ([place])   . OA (osteoarthritis)    feets, hands, knees, back.  Marland Kitchen PONV (postoperative nausea and vomiting)     Past Surgical History:  Past Surgical History:  Procedure Laterality Date  . BREAST LUMPECTOMY Bilateral 2010 (L) & 2012 (R)  . COLONOSCOPY  11/2009   1 descending colon adenomatous polyp; repeat in 5 years  .  COLONOSCOPY N/A 05/29/2014   [id number] coli/colonic diverticulosis  . ESOPHAGOGASTRODUODENOSCOPY N/A 05/29/2014   [id number] erosions s/p bx  . ESOPHAGOGASTRODUODENOSCOPY ENDOSCOPY  2001   Had stomach polyps (per patient); unable to find procedure notes.  . INCONTINENCE SURGERY    . KNEE ARTHROSCOPY    . SUPERFICIAL LYMPH NODE BIOPSY / EXCISION Bilateral    Bilateral breast associated    Family History:  Family History  Problem Relation Age of Onset  . Heart disease Mother   . High blood pressure Mother   . Diabetes Mellitus II Mother   . Colon cancer Neg Hx     Social History:  reports that she has never smoked. She has never used smokeless tobacco. She reports that she does not drink alcohol or use drugs.  Allergies:  Allergies  Allergen Reactions  . Celebrex [Celecoxib]   . Sulfa Antibiotics     Medications: Medications reviewed in epic  Please HPI for pertinent positives, otherwise complete 10 system ROS negative.  Mallampati Score: MD Evaluation Airway: WNL Heart: WNL Abdomen: WNL Chest/ Lungs: WNL ASA  Classification: 2 Mallampati/Airway Score: One  Physical Exam:  General: pleasant, obese white female who is laying in bed in NAD HEENT: head is normocephalic, atraumatic.  Sclera are noninjected.  PERRL.  Ears and nose without any masses or lesions.  Mouth is pink and moist Heart: regular, rate, and rhythm.  Normal  s1,s2. No obvious murmurs, gallops, or rubs noted.  Palpable radial and pedal pulses bilaterally Lungs: CTAB, no wheezes, rhonchi, or rales noted.  Respiratory effort nonlabored Abd: soft, NT, ND/obese, +BS, no masses, hernias, or organomegaly Psych: A&Ox3 with an appropriate affect.   Labs: Pending   Imaging: No results found.  Assessment/Plan 1. Abnormal liver appearance on MRI -we will plan to proceed with a random liver biopsy today -labs are pending.  Vitals have been reviewed.  First BP was 141/99.  This is going to be  retaken once the patient is settled in a relaxed.  She has taken her BP medications this morning and her BP is not usually this high -Risks and Benefits discussed with the patient including, but not limited to bleeding, infection, damage to adjacent structures or low yield requiring additional tests. All of the patient's questions were answered, patient is agreeable to proceed. Consent signed and in chart.  Thank you for this interesting consult.  I greatly enjoyed meeting NASYA VINCENT and look forward to participating in their care.  A copy of this report was sent to the requesting provider on this date.  Electronically Signed: Henreitta Cea 05/20/2016, 12:47 PM   I spent a total of  30 Minutes   in face to face in clinical consultation, greater than 50% of which was counseling/coordinating care for  Abnormal liver appearance on MRI

## 2016-05-20 NOTE — Procedures (Signed)
Pre Procedure Dx: Elevated LFTs Post Procedural Dx: Same  Technically successful US guided biopsy of right lobe of the liver.  EBL: None  No immediate complications.   Jay Fernandez Kenley, MD Pager #: [phone]    

## 2016-05-26 ENCOUNTER — Encounter: Payer: Self-pay | Admitting: Nurse Practitioner

## 2016-05-26 ENCOUNTER — Telehealth: Payer: Self-pay | Admitting: Internal Medicine

## 2016-05-26 NOTE — Telephone Encounter (Signed)
Pt was checking on her liver bx results. Please call her at [phone]

## 2016-05-26 NOTE — Telephone Encounter (Signed)
Please advise 

## 2016-05-27 ENCOUNTER — Telehealth: Payer: Self-pay | Admitting: Internal Medicine

## 2016-05-27 NOTE — Telephone Encounter (Signed)
See other phone note

## 2016-05-27 NOTE — Telephone Encounter (Signed)
PATIENT CALLED INQUIRING ABOUT LIVER BIOPSY RESULTS  VERY CONCERNED

## 2016-05-27 NOTE — Telephone Encounter (Signed)
Pt is aware of results. 

## 2016-05-27 NOTE — Telephone Encounter (Signed)
The results did not come back to me as they were ordered by the IR doctor. However, I was able to log in and retrieve them. The finally resulted sometime yesterday.  Her pathology results from the biopsy shows fibrosis (scarring). No excessive iron, no alpha-1-antitrypsin (from a genetic disease that can cause fibrosis). Overall, scarring is not specific but has the appearance of likely being caused by medications (such as the methotrexate we initially thought was the culprit). This essentially supports our suspicions that Methotrexate cause the liver damage and scarring and because she's no longer on it, she should not have worsening damage. We will continue to follow her as needed. At last check her liver function appeared normal. Her abdominal imaging did not show cirrhosis (end stage scarring).  Let me know if any questions.

## 2016-05-27 NOTE — Telephone Encounter (Signed)
Please advise 

## 2016-06-03 ENCOUNTER — Other Ambulatory Visit: Payer: Self-pay

## 2016-06-03 DIAGNOSIS — D649 Anemia, unspecified: Secondary | ICD-10-CM

## 2016-06-23 LAB — CBC WITH DIFFERENTIAL/PLATELET
BASOS ABS: 64 {cells}/uL (ref 0–200)
BASOS PCT: 1 %
EOS ABS: 128 {cells}/uL (ref 15–500)
Eosinophils Relative: 2 %
HCT: 34.6 % — ABNORMAL LOW (ref 35.0–45.0)
HEMOGLOBIN: 10.9 g/dL — AB (ref 11.7–15.5)
LYMPHS ABS: 1792 {cells}/uL (ref 850–3900)
Lymphocytes Relative: 28 %
MCH: 25.9 pg — AB (ref 27.0–33.0)
MCHC: 31.5 g/dL — ABNORMAL LOW (ref 32.0–36.0)
MCV: 82.2 fL (ref 80.0–100.0)
MONO ABS: 448 {cells}/uL (ref 200–950)
MPV: 10 fL (ref 7.5–12.5)
Monocytes Relative: 7 %
NEUTROS ABS: 3968 {cells}/uL (ref 1500–7800)
Neutrophils Relative %: 62 %
Platelets: 224 10*3/uL (ref 140–400)
RBC: 4.21 MIL/uL (ref 3.80–5.10)
RDW: 14.5 % (ref 11.0–15.0)
WBC: 6.4 10*3/uL (ref 3.8–10.8)

## 2016-07-01 NOTE — Telephone Encounter (Signed)
Please call the patient: Baby aspirin and Aleve could cause irritation/erosions or even ulcers which can cause bleeding and dark stools. Regarding the statins, her liver scarring (not likely cirrhosis) appears due to the Methotrexate which, because you're no longer on, should not have ongoing damage ("active liver disease"). Also, your liver function tests have been normal in the past 2 years. If he wants to start a statin that should be ok, but I would check LFTs about 6 weeks after starting them.   Please ask what kind of symptoms she's having for her "diverticulitis flares."

## 2016-07-12 ENCOUNTER — Ambulatory Visit (INDEPENDENT_AMBULATORY_CARE_PROVIDER_SITE_OTHER): Payer: Medicare Other | Admitting: Nurse Practitioner

## 2016-07-12 DIAGNOSIS — D649 Anemia, unspecified: Secondary | ICD-10-CM | POA: Diagnosis not present

## 2016-07-13 LAB — IFOBT (OCCULT BLOOD): IFOBT: NEGATIVE

## 2016-07-13 NOTE — Progress Notes (Signed)
Pt returned one iFOBT and it was negative.  

## 2016-08-04 ENCOUNTER — Ambulatory Visit (INDEPENDENT_AMBULATORY_CARE_PROVIDER_SITE_OTHER): Payer: Medicare Other | Admitting: Nurse Practitioner

## 2016-08-04 ENCOUNTER — Encounter: Payer: Self-pay | Admitting: Nurse Practitioner

## 2016-08-04 VITALS — BP 126/72 | HR 60 | Temp 97.0°F | Ht 64.0 in | Wt 205.8 lb

## 2016-08-04 DIAGNOSIS — R935 Abnormal findings on diagnostic imaging of other abdominal regions, including retroperitoneum: Secondary | ICD-10-CM | POA: Diagnosis not present

## 2016-08-04 NOTE — Patient Instructions (Signed)
1. Have your labs from when you're able to. 2. I recommend trying to come off statin cholesterol medicine, recheck enzymes after one month off statins, you can rechallenge statins with restarting them and recheck enzymes 30 days later. 3. However, in general principle, with elevated liver enzymes and abnormal MRI is likely best to avoid any medicines that can cause liver damage, such as statins. 4. Return for follow-up in 6 months. 5. Call if any worsening or severe symptoms.

## 2016-08-04 NOTE — Progress Notes (Signed)
Referring Provider: No ref. provider found Primary Care Physician:  System, Provider Not In Primary GI:  Dr. Gala Romney  Chief Complaint  Patient presents with  . abnormal liver mri    HPI:   Janet Lawson is a 61 y.o. female who presents For follow-up on abnormal liver MRI. Patient was last seen in our office 05/05/2016 for the same as well as GERD and transaminitis. At that time she presented to discuss liver biopsy with abnormal MRI showing fibrosis without cirrhosis unchanged since 2012 an appearance most likely due to chronic hepatic fibrosis which is also present on PET scan in 2012 without definite changes of cirrhosis or portal hypertension. Recommended liver biopsy to help with diagnostic purposes which she initially declined. Most recent CMP prior to her last office visit was 10/09/2014 which showed normalized AST/ALT, alkaline phosphatase, bilirubin, albumin. At her last visit she was doing well, GERD well-controlled on Protonix, seen in the PCP. Her primary care recommended pursuing a liver biopsy.  Labs including CMP, CBC essentially normal except for mild anemia without enough information to establish if this was a 10 baseline. Liver biopsy was completed and pathology showed fibrosis, no excessive iron, no alpha 1 antitrypsin, overall nonspecific but with the appearance of likely being caused by medications such as methotrexate which was the initial presumed etiology. Recommended following her liver function tests as she is off methotrexate and unlikely ongoing liver damage.iFOBT was recommended and found to be normal/negative. She mentioned her provider was considering starting her on a statin and I recommended close monitoring of LFTs if they elect to do so.  Today she states she's doing strange. States her liver seems to be doing ok. She is happy to hear that, however she cannot now take methotrexate for Psoriasis. She denies abdominal pain, N/V, hematochezia, melena. Has started  having bilateral leg aching and will be seeing her PCP but states they have been reducing her Neurontin previously. Denies yellowing of skin/eyes, darkened urine, acute episodic confusion, tremors. Denies chest pain, dyspnea, dizziness, lightheadedness, syncope, near syncope. Denies any other upper or lower GI symptoms.  Past Medical History:  Diagnosis Date  . Adenomatous polyp of colon 11/2009   recommended repeat in 5 years  . Breast CA ([place]) 2010 (L) and 2012 (R)   Bilateral  . 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 Complication of anesthesia   . Diabetes type 2, controlled ([place])   . Diverticulosis   . Duodenal ulcer   . Gastric ulcer   . GERD (gastroesophageal reflux disease)   . Hiatal hernia   . HTN (hypertension)   . Hypercholesteremia   . Melanoma ([place])   . OA (osteoarthritis)    feets, hands, knees, back.  Marland Kitchen PONV (postoperative nausea and vomiting)     Past Surgical History:  Procedure Laterality Date  . BREAST LUMPECTOMY Bilateral 2010 (L) & 2012 (R)  . COLONOSCOPY  11/2009   1 descending colon adenomatous polyp; repeat in 5 years  . COLONOSCOPY N/A 05/29/2014   QQV:ZDGLOVFIE coli/colonic diverticulosis  . ESOPHAGOGASTRODUODENOSCOPY N/A 05/29/2014   PPI:RJJOAC/ZY/SAYTKZS erosions s/p bx  . ESOPHAGOGASTRODUODENOSCOPY ENDOSCOPY  2001   Had stomach polyps (per patient); unable to find procedure notes.  . INCONTINENCE SURGERY    . KNEE ARTHROSCOPY    . SUPERFICIAL LYMPH NODE BIOPSY / EXCISION Bilateral    Bilateral breast associated    Current Outpatient Prescriptions  Medication Sig Dispense Refill  . aspirin EC 81 MG  tablet Take 81 mg by mouth daily.    Marland Kitchen atenolol (TENORMIN) 25 MG tablet Take 25 mg by mouth daily.     . Calcium Carb-Cholecalciferol (CALCIUM 600+D3 PO) Take 1 tablet by mouth 2 (two) times daily.    Marland Kitchen CINNAMON PO Take 2 capsules by mouth daily. 1000 mg per cap    . Co-Enzyme Q-10 100 MG CAPS Take 100 mg by mouth  daily.    Marland Kitchen gabapentin (NEURONTIN) 400 MG capsule Take 400 mg by mouth 3 (three) times daily.     . Lactobacillus-Inulin (CULTURELLE DIGESTIVE HEALTH PO) Take 1 capsule by mouth at bedtime.    . Linagliptin-Metformin HCl ER (JENTADUETO XR) 07-998 MG TB24 Take 1 tablet by mouth at bedtime.    Marland Kitchen losartan (COZAAR) 50 MG tablet Take 50 mg by mouth daily.     . magnesium oxide (MAG-OX) 400 MG tablet Take 400 mg by mouth daily.    . meloxicam (MOBIC) 15 MG tablet Take 15 mg by mouth daily.     . Omega-3 Fatty Acids (FISH OIL) 1000 MG CAPS Take 1 capsule by mouth daily.    . pantoprazole (PROTONIX) 40 MG tablet Take 1 tablet (40 mg total) by mouth daily. 90 tablet 3  . pioglitazone (ACTOS) 15 MG tablet Take 15 mg by mouth 2 (two) times daily.     . sertraline (ZOLOFT) 50 MG tablet Take 50 mg by mouth 2 (two) times daily.   1  . simvastatin (ZOCOR) 20 MG tablet Take 20 mg by mouth at bedtime.     . Turmeric 500 MG CAPS Take 1 capsule by mouth daily.    . Melatonin 5 MG TABS Take 5 mg by mouth at bedtime.     . metFORMIN (GLUCOPHAGE) 1000 MG tablet Take 1,000 mg by mouth 2 (two) times daily with a meal.     No current facility-administered medications for this visit.     Allergies as of 08/04/2016 - Review Complete 08/04/2016  Allergen Reaction Noted  . Celebrex [celecoxib]  02/24/2011  . Sulfa antibiotics  02/24/2011    Family History  Problem Relation Age of Onset  . Heart disease Mother   . High blood pressure Mother   . Diabetes Mellitus II Mother   . Colon cancer Neg Hx     Social History   Social History  . Marital status: Married    Spouse name: N/A  . Number of children: N/A  . Years of education: N/A   Social History Main Topics  . Smoking status: Never Smoker  . Smokeless tobacco: Never Used  . Alcohol use No  . Drug use: No  . Sexual activity: Not Asked   Other Topics Concern  . None   Social History Narrative  . None    Review of Systems: General: Negative  for anorexia, weight loss, fever, chills, fatigue, weakness. ENT: Negative for hoarseness, difficulty swallowing. CV: Negative for chest pain, angina, palpitations, peripheral edema.  Respiratory: Negative for dyspnea at rest, cough, sputum, wheezing.  GI: See history of present illness. Endo: Negative for unusual weight change.  Heme: Negative for bruising or bleeding. Allergy: Negative for rash or hives.   Physical Exam: BP 126/72   Pulse 60   Temp 97 F (36.1 C) (Oral)   Ht 5\' 4"  (1.626 m)   Wt 205 lb 12.8 oz (93.4 kg)   BMI 35.33 kg/m  General:   Obese female. Alert and oriented. Pleasant and cooperative. Well-nourished and well-developed.  Head:  Normocephalic and atraumatic. Eyes:  Without icterus, sclera clear and conjunctiva pink.  Ears:  Normal auditory acuity. Cardiovascular:  S1, S2 present without murmurs appreciated. Extremities without clubbing or edema. Respiratory:  Clear to auscultation bilaterally. No wheezes, rales, or rhonchi. No distress.  Gastrointestinal:  +BS, rounded but soft, non-tender and non-distended. No HSM noted. No guarding or rebound. No masses appreciated.  Rectal:  Deferred  Musculoskalatal:  Symmetrical without gross deformities. Neurologic:  Alert and oriented x4;  grossly normal neurologically. Psych:  Alert and cooperative. Normal mood and affect. Heme/Lymph/Immune: No excessive bruising noted.    08/04/2016 2:05 PM   Disclaimer: This note was dictated with voice recognition software. Similar sounding words can inadvertently be transcribed and may not be corrected upon review.

## 2016-08-04 NOTE — Assessment & Plan Note (Signed)
History of abnormal liver MRI. Liver biopsy showed consistent with likely drug-induced injury, likely methotrexate. She is still on a statin. I discussed that her primary care can consider holding the statin for 30 days, recheck liver enzymes, restart statin, and restart liver enzymes to see if this causes any change in her liver function tests. Today I'll check CBC for her anemia history, CMP, PTT/INR. Her colonoscopy is up-to-date last completed 1 year ago. Likely mild anemia as a baseline. Return for follow-up in 6 months.

## 2016-08-04 NOTE — Progress Notes (Signed)
cc'ed to pcp °

## 2016-08-11 LAB — CBC WITH DIFFERENTIAL/PLATELET
BASOS ABS: 54 {cells}/uL (ref 0–200)
Basophils Relative: 1 %
EOS PCT: 2 %
Eosinophils Absolute: 108 cells/uL (ref 15–500)
HCT: 34.3 % — ABNORMAL LOW (ref 35.0–45.0)
Hemoglobin: 10.6 g/dL — ABNORMAL LOW (ref 11.7–15.5)
LYMPHS ABS: 1782 {cells}/uL (ref 850–3900)
LYMPHS PCT: 33 %
MCH: 24.7 pg — AB (ref 27.0–33.0)
MCHC: 30.9 g/dL — AB (ref 32.0–36.0)
MCV: 80 fL (ref 80.0–100.0)
MONOS PCT: 7 %
MPV: 9.7 fL (ref 7.5–12.5)
Monocytes Absolute: 378 cells/uL (ref 200–950)
NEUTROS PCT: 57 %
Neutro Abs: 3078 cells/uL (ref 1500–7800)
Platelets: 207 10*3/uL (ref 140–400)
RBC: 4.29 MIL/uL (ref 3.80–5.10)
RDW: 15.3 % — AB (ref 11.0–15.0)
WBC: 5.4 10*3/uL (ref 3.8–10.8)

## 2016-08-11 LAB — COMPREHENSIVE METABOLIC PANEL
ALBUMIN: 3.7 g/dL (ref 3.6–5.1)
ALT: 10 U/L (ref 6–29)
AST: 17 U/L (ref 10–35)
Alkaline Phosphatase: 62 U/L (ref 33–130)
BILIRUBIN TOTAL: 0.4 mg/dL (ref 0.2–1.2)
BUN: 21 mg/dL (ref 7–25)
CO2: 26 mmol/L (ref 20–31)
CREATININE: 0.92 mg/dL (ref 0.50–0.99)
Calcium: 9 mg/dL (ref 8.6–10.4)
Chloride: 104 mmol/L (ref 98–110)
GLUCOSE: 117 mg/dL — AB (ref 65–99)
Potassium: 4.8 mmol/L (ref 3.5–5.3)
SODIUM: 140 mmol/L (ref 135–146)
Total Protein: 6.5 g/dL (ref 6.1–8.1)

## 2016-08-11 LAB — PROTIME-INR
INR: 1.1
Prothrombin Time: 11.4 s (ref 9.0–11.5)

## 2016-08-12 ENCOUNTER — Other Ambulatory Visit: Payer: Self-pay | Admitting: Nurse Practitioner

## 2016-08-12 DIAGNOSIS — K219 Gastro-esophageal reflux disease without esophagitis: Secondary | ICD-10-CM

## 2016-12-06 ENCOUNTER — Observation Stay (HOSPITAL_COMMUNITY)
Admission: AD | Admit: 2016-12-06 | Discharge: 2016-12-07 | Disposition: A | Discharge status: Home / Self Care | Payer: Medicare Other | Source: Other Acute Inpatient Hospital | Attending: Cardiovascular Disease | Admitting: Cardiovascular Disease

## 2016-12-06 ENCOUNTER — Inpatient Hospital Stay (HOSPITAL_COMMUNITY): Payer: Medicare Other

## 2016-12-06 ENCOUNTER — Encounter (HOSPITAL_COMMUNITY): Payer: Self-pay | Admitting: *Deleted

## 2016-12-06 DIAGNOSIS — I481 Persistent atrial fibrillation: Secondary | ICD-10-CM | POA: Diagnosis present

## 2016-12-06 DIAGNOSIS — R748 Abnormal levels of other serum enzymes: Secondary | ICD-10-CM | POA: Diagnosis not present

## 2016-12-06 DIAGNOSIS — E669 Obesity, unspecified: Secondary | ICD-10-CM | POA: Diagnosis not present

## 2016-12-06 DIAGNOSIS — I472 Ventricular tachycardia: Secondary | ICD-10-CM

## 2016-12-06 DIAGNOSIS — Z9221 Personal history of antineoplastic chemotherapy: Secondary | ICD-10-CM | POA: Insufficient documentation

## 2016-12-06 DIAGNOSIS — E785 Hyperlipidemia, unspecified: Secondary | ICD-10-CM | POA: Insufficient documentation

## 2016-12-06 DIAGNOSIS — E1140 Type 2 diabetes mellitus with diabetic neuropathy, unspecified: Secondary | ICD-10-CM | POA: Insufficient documentation

## 2016-12-06 DIAGNOSIS — Z23 Encounter for immunization: Secondary | ICD-10-CM | POA: Diagnosis not present

## 2016-12-06 DIAGNOSIS — I4891 Unspecified atrial fibrillation: Secondary | ICD-10-CM | POA: Diagnosis not present

## 2016-12-06 DIAGNOSIS — Z853 Personal history of malignant neoplasm of breast: Secondary | ICD-10-CM | POA: Diagnosis not present

## 2016-12-06 DIAGNOSIS — Z882 Allergy status to sulfonamides status: Secondary | ICD-10-CM | POA: Insufficient documentation

## 2016-12-06 DIAGNOSIS — E119 Type 2 diabetes mellitus without complications: Secondary | ICD-10-CM | POA: Diagnosis not present

## 2016-12-06 DIAGNOSIS — Z6841 Body Mass Index (BMI) 40.0 and over, adult: Secondary | ICD-10-CM | POA: Insufficient documentation

## 2016-12-06 DIAGNOSIS — R9431 Abnormal electrocardiogram [ECG] [EKG]: Secondary | ICD-10-CM | POA: Diagnosis not present

## 2016-12-06 DIAGNOSIS — I10 Essential (primary) hypertension: Secondary | ICD-10-CM

## 2016-12-06 DIAGNOSIS — G2581 Restless legs syndrome: Secondary | ICD-10-CM | POA: Insufficient documentation

## 2016-12-06 DIAGNOSIS — M1990 Unspecified osteoarthritis, unspecified site: Secondary | ICD-10-CM | POA: Insufficient documentation

## 2016-12-06 DIAGNOSIS — I499 Cardiac arrhythmia, unspecified: Secondary | ICD-10-CM | POA: Diagnosis present

## 2016-12-06 LAB — COMPREHENSIVE METABOLIC PANEL
ALT: 14 U/L (ref 14–54)
AST: 24 U/L (ref 15–41)
Albumin: 3.6 g/dL (ref 3.5–5.0)
Alkaline Phosphatase: 56 U/L (ref 38–126)
Anion gap: 10 (ref 5–15)
BUN: 15 mg/dL (ref 6–20)
CHLORIDE: 106 mmol/L (ref 101–111)
CO2: 23 mmol/L (ref 22–32)
Calcium: 8.8 mg/dL — ABNORMAL LOW (ref 8.9–10.3)
Creatinine, Ser: 0.83 mg/dL (ref 0.44–1.00)
Glucose, Bld: 149 mg/dL — ABNORMAL HIGH (ref 65–99)
POTASSIUM: 3.4 mmol/L — AB (ref 3.5–5.1)
SODIUM: 139 mmol/L (ref 135–145)
Total Bilirubin: 0.9 mg/dL (ref 0.3–1.2)
Total Protein: 6.9 g/dL (ref 6.5–8.1)

## 2016-12-06 LAB — TROPONIN I
Troponin I: 0.25 ng/mL (ref <0.03)
Troponin I: 0.17 ng/mL (ref <0.03)
Troponin I: 0.09 ng/mL (ref <0.03)

## 2016-12-06 LAB — GLUCOSE, CAPILLARY
GLUCOSE-CAPILLARY: 162 mg/dL — AB (ref 65–99)
GLUCOSE-CAPILLARY: 155 mg/dL — AB (ref 65–99)
GLUCOSE-CAPILLARY: 147 mg/dL — AB (ref 65–99)
Glucose-Capillary: 111 mg/dL — ABNORMAL HIGH (ref 65–99)
Glucose-Capillary: 147 mg/dL — ABNORMAL HIGH (ref 65–99)

## 2016-12-06 LAB — TSH: TSH: 1.89 u[IU]/mL (ref 0.350–4.500)

## 2016-12-06 LAB — ECHOCARDIOGRAM COMPLETE
Height: 64 in
Weight: 3315.72 oz

## 2016-12-06 LAB — MAGNESIUM: MAGNESIUM: 1.6 mg/dL — AB (ref 1.7–2.4)

## 2016-12-06 LAB — HIV ANTIBODY (ROUTINE TESTING W REFLEX): HIV SCREEN 4TH GENERATION: NONREACTIVE

## 2016-12-06 LAB — MRSA PCR SCREENING: MRSA by PCR: POSITIVE — AB

## 2016-12-06 MED ORDER — BACID PO TABS
1.0000 | ORAL_TABLET | Freq: Every day | ORAL | Status: DC
  Administered 2016-12-06: 1 via ORAL
  Filled 2016-12-06: qty 1

## 2016-12-06 MED ORDER — ATENOLOL 25 MG PO TABS
25.0000 mg | ORAL_TABLET | Freq: Every day | ORAL | Status: DC
  Administered 2016-12-06 – 2016-12-07 (×2): 25 mg via ORAL
  Filled 2016-12-06 (×3): qty 1

## 2016-12-06 MED ORDER — LOSARTAN POTASSIUM 50 MG PO TABS
50.0000 mg | ORAL_TABLET | Freq: Every day | ORAL | Status: DC
  Administered 2016-12-06: 50 mg via ORAL
  Filled 2016-12-06 (×2): qty 1

## 2016-12-06 MED ORDER — CO-ENZYME Q-10 100 MG PO CAPS: 100.0000 mg | ORAL_CAPSULE | Freq: Every day | ORAL | Status: DC

## 2016-12-06 MED ORDER — TURMERIC 500 MG PO CAPS: 1.0000 | ORAL_CAPSULE | Freq: Every day | ORAL | Status: DC

## 2016-12-06 MED ORDER — INSULIN ASPART 100 UNIT/ML ~~LOC~~ SOLN
0.0000 [IU] | Freq: Three times a day (TID) | SUBCUTANEOUS | Status: DC
  Administered 2016-12-06: 3 [IU] via SUBCUTANEOUS
  Administered 2016-12-06 – 2016-12-07 (×2): 2 [IU] via SUBCUTANEOUS

## 2016-12-06 MED ORDER — MUPIROCIN 2 % EX OINT
1.0000 "application " | TOPICAL_OINTMENT | Freq: Two times a day (BID) | CUTANEOUS | Status: DC
  Administered 2016-12-06 – 2016-12-07 (×3): 1 via NASAL
  Filled 2016-12-06 (×2): qty 22

## 2016-12-06 MED ORDER — POTASSIUM CHLORIDE CRYS ER 20 MEQ PO TBCR
30.0000 meq | EXTENDED_RELEASE_TABLET | ORAL | Status: AC
  Administered 2016-12-06 (×2): 30 meq via ORAL
  Filled 2016-12-06 (×2): qty 1

## 2016-12-06 MED ORDER — MAGNESIUM SULFATE 50 % IJ SOLN
3.0000 g | Freq: Once | INTRAVENOUS | Status: AC
  Administered 2016-12-06: 3 g via INTRAVENOUS
  Filled 2016-12-06 (×2): qty 6

## 2016-12-06 MED ORDER — GABAPENTIN 400 MG PO CAPS
400.0000 mg | ORAL_CAPSULE | Freq: Three times a day (TID) | ORAL | Status: DC
  Administered 2016-12-06 – 2016-12-07 (×4): 400 mg via ORAL
  Filled 2016-12-06 (×4): qty 1

## 2016-12-06 MED ORDER — MELOXICAM 7.5 MG PO TABS
15.0000 mg | ORAL_TABLET | Freq: Every day | ORAL | Status: DC
  Administered 2016-12-06 – 2016-12-07 (×2): 15 mg via ORAL
  Filled 2016-12-06 (×3): qty 2

## 2016-12-06 MED ORDER — CHLORHEXIDINE GLUCONATE CLOTH 2 % EX PADS
6.0000 | MEDICATED_PAD | Freq: Every day | CUTANEOUS | Status: DC
  Administered 2016-12-06 – 2016-12-07 (×2): 6 via TOPICAL

## 2016-12-06 MED ORDER — CALCIUM CARBONATE-VITAMIN D 500-200 MG-UNIT PO TABS
1.0000 | ORAL_TABLET | Freq: Two times a day (BID) | ORAL | Status: DC
  Administered 2016-12-06 – 2016-12-07 (×3): 1 via ORAL
  Filled 2016-12-06 (×3): qty 1

## 2016-12-06 MED ORDER — INSULIN ASPART 100 UNIT/ML ~~LOC~~ SOLN: 0.0000 [IU] | Freq: Every day | SUBCUTANEOUS | Status: DC

## 2016-12-06 MED ORDER — ASPIRIN EC 81 MG PO TBEC
81.0000 mg | DELAYED_RELEASE_TABLET | Freq: Every day | ORAL | Status: DC
  Administered 2016-12-06: 81 mg via ORAL
  Filled 2016-12-06: qty 1

## 2016-12-06 MED ORDER — SIMVASTATIN 20 MG PO TABS
20.0000 mg | ORAL_TABLET | Freq: Every day | ORAL | Status: DC
  Administered 2016-12-06: 20 mg via ORAL
  Filled 2016-12-06: qty 1

## 2016-12-06 MED ORDER — MAGNESIUM OXIDE 400 (241.3 MG) MG PO TABS
400.0000 mg | ORAL_TABLET | Freq: Every day | ORAL | Status: DC
  Administered 2016-12-06 – 2016-12-07 (×2): 400 mg via ORAL
  Filled 2016-12-06 (×2): qty 1

## 2016-12-06 MED ORDER — INFLUENZA VAC SPLIT QUAD 0.5 ML IM SUSY
0.5000 mL | PREFILLED_SYRINGE | INTRAMUSCULAR | Status: AC
  Administered 2016-12-07: 0.5 mL via INTRAMUSCULAR

## 2016-12-06 MED ORDER — METFORMIN HCL 500 MG PO TABS
1000.0000 mg | ORAL_TABLET | Freq: Two times a day (BID) | ORAL | Status: DC
  Administered 2016-12-06 – 2016-12-07 (×3): 1000 mg via ORAL
  Filled 2016-12-06 (×3): qty 2

## 2016-12-06 MED ORDER — SODIUM CHLORIDE 0.9 % IV SOLN
INTRAVENOUS | Status: DC
  Administered 2016-12-06 – 2016-12-07 (×2): via INTRAVENOUS

## 2016-12-06 MED ORDER — MELATONIN 3 MG PO TABS
4.5000 mg | ORAL_TABLET | Freq: Every day | ORAL | Status: DC
  Administered 2016-12-06: 4.5 mg via ORAL
  Filled 2016-12-06: qty 1.5

## 2016-12-06 MED ORDER — APIXABAN 5 MG PO TABS
5.0000 mg | ORAL_TABLET | Freq: Two times a day (BID) | ORAL | Status: DC
  Administered 2016-12-06 – 2016-12-07 (×3): 5 mg via ORAL
  Filled 2016-12-06 (×3): qty 1

## 2016-12-06 MED ORDER — PIOGLITAZONE HCL 15 MG PO TABS
15.0000 mg | ORAL_TABLET | Freq: Two times a day (BID) | ORAL | Status: DC
  Administered 2016-12-06 – 2016-12-07 (×3): 15 mg via ORAL
  Filled 2016-12-06 (×4): qty 1

## 2016-12-06 NOTE — Discharge Instructions (Addendum)
Please make follow up appointments to see: Primiary care and cardiology within 7-10 days as gastroenterology in the next couple-few weeks or as recommended by your primary doctor.   Information on my medicine - ELIQUIS (apixaban)  This medication education was reviewed with me or my healthcare representative as part of my discharge preparation.  The pharmacist that spoke with me during my hospital stay was:  Geraldo Pitter  Why was Eliquis prescribed for you? Eliquis was prescribed for you to reduce the risk of a blood clot forming that can cause a stroke if you have a medical condition called atrial fibrillation (a type of irregular heartbeat).  What do You need to know about Eliquis ? Take your Eliquis TWICE DAILY - one tablet in the morning and one tablet in the evening with or without food. If you have difficulty swallowing the tablet whole please discuss with your pharmacist how to take the medication safely.  Take Eliquis exactly as prescribed by your doctor and DO NOT stop taking Eliquis without talking to the doctor who prescribed the medication.  Stopping may increase your risk of developing a stroke.  Refill your prescription before you run out.  After discharge, you should have regular check-up appointments with your healthcare provider that is prescribing your Eliquis.  In the future your dose may need to be changed if your kidney function or weight changes by a significant amount or as you get older.  What do you do if you miss a dose? If you miss a dose, take it as soon as you remember on the same day and resume taking twice daily.  Do not take more than one dose of ELIQUIS at the same time to make up a missed dose.  Important Safety Information A possible side effect of Eliquis is bleeding. You should call your healthcare provider right away if you experience any of the following: ? Bleeding from an injury or your nose that does not stop. ? Unusual colored urine (red  or dark brown) or unusual colored stools (red or black). ? Unusual bruising for unknown reasons. ? A serious fall or if you hit your head (even if there is no bleeding).  Some medicines may interact with Eliquis and might increase your risk of bleeding or clotting while on Eliquis. To help avoid this, consult your healthcare provider or pharmacist prior to using any new prescription or non-prescription medications, including herbals, vitamins, non-steroidal anti-inflammatory drugs (NSAIDs) and supplements.  This website has more information on Eliquis (apixaban): http://www.eliquis.com/eliquis/home

## 2016-12-06 NOTE — Progress Notes (Signed)
  Echocardiogram 2D Echocardiogram has been performed.  Merrie Roof F 12/06/2016, 11:04 AM

## 2016-12-06 NOTE — Consult Note (Signed)
Cardiology Consultation:   Patient ID: Janet Lawson; [id number]; 1955-10-28   Admit date: 12/06/2016 Date of Consult: 12/06/2016  Primary Care Provider: System, Provider Not In Primary Cardiologist: new to Janet Lawson   Patient Profile:   Janet Lawson is a 61 y.o. female with a hx of DM, HTN, obesity, Breast cancer, psoriasis who is being seen today for the evaluation of WCT at the request of Janet Lawson.  History of Present Illness:   Janet Lawson presented to Janet Lawson with 24+ hours of persistent nausea, vomiting and loose BM (denies diarrhea) she saught attention due to this, had no palpitations or CP, no SOB.  She reports hx of some palpitations with a cardiac evaluation about 2 years ago in [place].  She underwent stress testing and echo, and the only finding was HTN, no palpitations since then until yesterday, when she was in the ER.  She had gone to ER for Above complaints, and ECG on arrival was normal.  She woke with pounding fast heart beat, as this persisted she developed chest pressure, no syncope. Some SOB  Labs [facility]   Mg 1.2 and K 3.4 ( See Below)   Medical therapy to slow down and subsequently acclelerated to 220 or so--irregularly irregular.  Beccause of worsening symptoms, she underwent cardioversion with restoration of sinus  Gradually her GI symptoms abated.  She had had similar symptoms about a year ago, no diagnosis was made-- she follows with Janet Lawson in [place]  At baseline no SOB CP or LH.  She does snore and have daytime somnolence   No bleeding hx  B breast Ca with hx of Cytoxan and MTX exposure ( the latter for Psoraisis)   Thromboembolic risk factors (  HTN-1, DM-1, Gender-1) for a CHADSVASc Score of 3   Past Medical History:  Diagnosis Date  . Adenomatous polyp of colon 11/2009   recommended repeat in 5 years  . Breast CA (Janet Lawson) 2010 (L) and 2012 (R)   Bilateral  . Chemotherapy-induced neuropathy (Janet Lawson)    bilateral hands and feet  .  Complication of anesthesia   . Diabetes type 2, controlled ([place])   . Diverticulosis   . Duodenal ulcer   . Gastric ulcer   . GERD (gastroesophageal reflux disease)   . Hiatal hernia   . HTN (hypertension)   . Hypercholesteremia   . Melanoma ([place])   . OA (osteoarthritis)    feets, hands, knees, back.  Marland Kitchen PONV (postoperative nausea and vomiting)     Past Surgical History:  Procedure Laterality Date  . BREAST LUMPECTOMY Bilateral 2010 (L) & 2012 (R)  . COLONOSCOPY  11/2009   1 descending colon adenomatous polyp; repeat in 5 years  . COLONOSCOPY N/A 05/29/2014   URK:YHCWCBJSE coli/colonic diverticulosis  . ESOPHAGOGASTRODUODENOSCOPY N/A 05/29/2014   GBT:DVVOHY/WV/PXTGGYI erosions s/p bx  . ESOPHAGOGASTRODUODENOSCOPY ENDOSCOPY  2001   Had stomach polyps (per patient); unable to find procedure notes.  . INCONTINENCE SURGERY    . KNEE ARTHROSCOPY    . SUPERFICIAL LYMPH NODE BIOPSY / EXCISION Bilateral    Bilateral breast associated      Inpatient Medications: Scheduled Meds: . aspirin EC  81 mg Oral Daily  . atenolol  25 mg Oral Daily  . calcium-vitamin D  1 tablet Oral BID WC  . Chlorhexidine Gluconate Cloth  6 each Topical Q0600  . gabapentin  400 mg Oral TID  . insulin aspart  0-15 Units Subcutaneous TID WC  . insulin aspart  0-5 Units Subcutaneous QHS  . lactobacillus acidophilus  1 tablet Oral QHS  . losartan  50 mg Oral Daily  . magnesium oxide  400 mg Oral Daily  . Melatonin  4.5 mg Oral QHS  . meloxicam  15 mg Oral Daily  . metFORMIN  1,000 mg Oral BID WC  . mupirocin ointment  1 application Nasal BID  . pioglitazone  15 mg Oral BID  . potassium chloride  30 mEq Oral Q4H  . simvastatin  20 mg Oral QHS   Continuous Infusions: . sodium chloride 50 mL/hr at 12/06/16 0700  . magnesium sulfate 1 - 4 g bolus IVPB     PRN Meds:   Allergies:    Allergies  Allergen Reactions  . Celebrex [Celecoxib]   . Sulfa Antibiotics     Social History:   Social History    Social History  . Marital status: Married    Spouse name: N/A  . Number of children: N/A  . Years of education: N/A   Occupational History  . Not on file.   Social History Main Topics  . Smoking status: Never Smoker  . Smokeless tobacco: Never Used  . Alcohol use No  . Drug use: No  . Sexual activity: Not on file   Other Topics Concern  . Not on file   Social History Narrative  . No narrative on file    Family History:   Family History  Problem Relation Age of Onset  . Heart disease Mother   . High blood pressure Mother   . Diabetes Mellitus II Mother   . Colon cancer Neg Hx      ROS:  Please see the history of present illness.  ROS  All other ROS reviewed and negative.     Physical Exam/Data:   Vitals:   12/06/16 0600 12/06/16 0630 12/06/16 0700 12/06/16 0730  BP: (!) 151/98 (!) 145/63 (!) 81/55 127/70  Pulse: 70 73 70 68  Resp: 17 20 16 17   Temp:   97.7 F (36.5 C)   TempSrc:   Oral   SpO2: 100% 100% 100% 98%  Weight:      Height:        Intake/Output Summary (Last 24 hours) at 12/06/16 1117 Last data filed at 12/06/16 0700  Gross per 24 hour  Intake            80.83 ml  Output              200 ml  Net          -119.17 ml   Filed Weights   12/06/16 0400  Weight: 207 lb 3.7 oz (94 kg)   Body mass index is 35.57 kg/m.  General:  Well nourished, well developed, Morbidly obese  in no acute distress HEENT: normal Lymph: no adenopathy Neck: no JVD Endocrine:  No thryomegaly Vascular: No carotid bruits Cardiac:  normal S1, S2; RRR; no murmurs, gallops or rubs Lungs:  CTA b/l, no wheezing, rhonchi or rales  Abd: soft, nontender  Ext: no edema Musculoskeletal:  No deformities, BUE and BLE strength normal and equal Skin: warm and dry  Neuro:  No gross focal abnormalities noted  Legs restless  Psych:  Normal affect   EKG:  The EKG was personally reviewed and demonstrates: [facility] is sinus  2100 2300 hr AFib >>  0020  hr >>WCT that is [place] >>DCCV  SR Telemetry:  Personally reviewed   SR freq PACs  Relevant CV Studies:  Echo is ordered/pending  Laboratory Data:  Chemistry Recent Labs Lab 12/06/16 0623  NA 139  K 3.4*  CL 106  CO2 23  GLUCOSE 149*  BUN 15  CREATININE 0.83  CALCIUM 8.8*  GFRNONAA >60  GFRAA >60  ANIONGAP 10     Recent Labs Lab 12/06/16 0623  PROT 6.9  ALBUMIN 3.6  AST 24  ALT 14  ALKPHOS 56  BILITOT 0.9   HematologyNo results for input(s): WBC, RBC, HGB, HCT, MCV, MCH, MCHC, RDW, PLT in the last 168 hours. Cardiac Enzymes Recent Labs Lab 12/06/16 0623  TROPONINI 0.25*   No results for input(s): TROPIPOC in the last 168 hours.  BNPNo results for input(s): BNP, PROBNP in the last 168 hours.  DDimer No results for input(s): DDIMER in the last 168 hours.  Radiology/Studies:  No results found.  Assessment and Plan:   1. WCT     Suspect abberrent AFib w/RVR  2. New AFib     CHA2DS2Vasc is at least 3     Remains in SR post cardiversion  3. Sleep disordered breathing   4. HTN   5. DM     Significant b/l hands/feet neuropathy, RLS  6. Troponin elevation     Likely 2/2 RVR, shock     No anginal complaints,describes good exertional tolerance without symptoms  7. GI illness     Currently remains resolved  8. Restless legs   For questions or updates, please contact [place] Please consult [email or web address] for contact info under Cardiology/STEMI.   Signed, Baldwin [place], PA-C  12/06/2016 11:17 AM  Suspect WCT was Afib w aberration,  If echo is abnormal may need to rethink this The Afib occurred this time acutely in the setting of the GI illness, a recurring and hitherto not diagnosed issue   For now, would anticoagulate given CHADSVAsc score and can consider the role of ILR to assess recurrences, as the symptoms may have been aggravated by the GI Issue and she may have more AFib than we think  She will need GI followup   Hopefully  home in am

## 2016-12-06 NOTE — H&P (Signed)
History and Physical   Patient ID: Janet Lawson, MRN: [id number], DOB: 1955-03-21   Date of Encounter: 12/06/2016, 4:29 AM  Primary Care Provider: System, Provider Not In Cardiologist: NA Electrophysiologist:  NA  Chief Complaint:  Wide complex tachycardia, poss VT  History of Present Illness: Janet Lawson is a 61 y.o. female w/ PMHx as below transferred from [facility] due to an episode of WCT, possible VT that occurred there earlier this evening. I received a phone call from the ED earlier tonight telling me that the patient had come in in NSR, went into afib during her ED visit, and subsequently went into WCT that lasted "a very long time, at least 15 minutes" per the ED physicians. Unclear what exactly went on during that 15 min time period, but the patient was nauseous, vomited, and eventually began c/o severe CP and so a decision was made to proceed w/ DCCV. The cardioversion was successful at restoring NSR. The patient has maintained NSR since the episode. I was told by the ED that her "labs were OK", however the Mag was 1.2. K was 3.9. QTc prior to the episode was 438ms. The patient had been given several doses of IV zofran in the ED there. She is on at least two chronic drugs which prolong the QT interval--sertraline and pantoprazole. Patient says she initially went into the ED because she had been feeling sick since last Saturday. She has been very nauseous w/ accompanying vomiting and diarrhea. She had been unable to keep down anything PO for at least 2 days PTA at the ED. She denies F/C, CP, SOB/DOE, palpitations, edema or other HF sx, presyncope or syncope prior to this ED visit. She was able to exert herself w/o any significant limiting cardiac sx. She had sporadic palpitations from time to time over the past years but nothing that made her feel the way she felt during the arrhythmias this evening.  Past Medical History:  Diagnosis Date  . Adenomatous polyp of  colon 11/2009   recommended repeat in 5 years  . Breast CA ([place]) 2010 (L) and 2012 (R)   Bilateral  . 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 Complication of anesthesia   . Diabetes type 2, controlled ([place])   . Diverticulosis   . Duodenal ulcer   . Gastric ulcer   . GERD (gastroesophageal reflux disease)   . Hiatal hernia   . HTN (hypertension)   . Hypercholesteremia   . Melanoma ([place])   . OA (osteoarthritis)    feets, hands, knees, back.  Marland Kitchen PONV (postoperative nausea and vomiting)     Past Surgical History:  Procedure Laterality Date  . BREAST LUMPECTOMY Bilateral 2010 (L) & 2012 (R)  . COLONOSCOPY  11/2009   1 descending colon adenomatous polyp; repeat in 5 years  . COLONOSCOPY N/A 05/29/2014   QQV:ZDGLOVFIE coli/colonic diverticulosis  . ESOPHAGOGASTRODUODENOSCOPY N/A 05/29/2014   PPI:RJJOAC/ZY/SAYTKZS erosions s/p bx  . ESOPHAGOGASTRODUODENOSCOPY ENDOSCOPY  2001   Had stomach polyps (per patient); unable to find procedure notes.  . INCONTINENCE SURGERY    . KNEE ARTHROSCOPY    . SUPERFICIAL LYMPH NODE BIOPSY / EXCISION Bilateral    Bilateral breast associated     Prior to Admission medications   Medication Sig Start Date End Date Taking? Authorizing Provider  aspirin EC 81 MG tablet Take 81 mg by mouth daily.    [provider]  atenolol (TENORMIN) 25 MG tablet Take 25 mg  by mouth daily.     [provider]  Calcium Carb-Cholecalciferol (CALCIUM 600+D3 PO) Take 1 tablet by mouth 2 (two) times daily.    [provider]  CINNAMON PO Take 2 capsules by mouth daily. 1000 mg per cap    [provider]  Co-Enzyme Q-10 100 MG CAPS Take 100 mg by mouth daily.    [provider]  gabapentin (NEURONTIN) 400 MG capsule Take 400 mg by mouth 3 (three) times daily.  06/18/13   [provider]  Lactobacillus-Inulin (CULTURELLE DIGESTIVE HEALTH PO) Take 1 capsule by mouth at bedtime.    [provider]  Linagliptin-Metformin HCl ER (JENTADUETO XR) 07-998 MG TB24 Take 1 tablet by mouth at bedtime.    [provider]  losartan (COZAAR) 50 MG tablet Take 50 mg by mouth daily.     [provider]  magnesium oxide (MAG-OX) 400 MG tablet Take 400 mg by mouth daily.    [provider]  Melatonin 5 MG TABS Take 5 mg by mouth at bedtime.     [provider]  meloxicam (MOBIC) 15 MG tablet Take 15 mg by mouth daily.     [provider]  metFORMIN (GLUCOPHAGE) 1000 MG tablet Take 1,000 mg by mouth 2 (two) times daily with a meal.    [provider]  Omega-3 Fatty Acids (FISH OIL) 1000 MG CAPS Take 1 capsule by mouth daily.    [provider]  pantoprazole (PROTONIX) 40 MG tablet TAKE 1 TABLET (40 MG TOTAL) BY MOUTH DAILY. 08/13/16   Carlis Stable, NP  pioglitazone (ACTOS) 15 MG tablet Take 15 mg by mouth 2 (two) times daily.     [provider]  sertraline (ZOLOFT) 50 MG tablet Take 50 mg by mouth 2 (two) times daily.  02/15/14   [provider]  simvastatin (ZOCOR) 20 MG tablet Take 20 mg by mouth at bedtime.     [provider]  Turmeric 500 MG CAPS Take 1 capsule by mouth daily.    [provider]     Allergies: Allergies  Allergen Reactions  . Celebrex [Celecoxib]   . Sulfa Antibiotics     Social History:  The patient  reports that she has never smoked. She has never used smokeless tobacco. She reports that she does not drink alcohol or use drugs.   Family History:  The patient's family history includes Diabetes Mellitus II in her mother; Heart disease in her mother; High blood pressure in her mother.   ROS:  Please see the history of present illness.     All other systems reviewed and negative.   Vital Signs: Height 5\' 4"  (1.626 m), weight 94 kg (207 lb 3.7 oz).  PHYSICAL EXAM: General:  Well nourished, well developed, in no acute distress HEENT: normal Lymph: no  adenopathy Neck: no JVD Endocrine:  No thryomegaly Vascular: No carotid bruits; DP pulses 2+ bilaterally  Cardiac:  normal S1, S2; RRR; no murmur  Lungs:  clear to auscultation bilaterally, no wheezing, rhonchi or rales  Abd: soft, nontender, no hepatomegaly  Ext: no edema Musculoskeletal:  No deformities, BUE and BLE strength normal and equal Skin: warm and dry  Neuro:  CNs 2-12 intact, no focal abnormalities noted Psych:  Normal affect   EKG:  EKG 1 shows NSR, rate 74, QTc 453ms  EKG 2 shows afib w/ rate 163, QTc 541ms  EKG 3 shows WCT at a rate of 226bpm  EKG 4  shows NSR with rate 79, QTc 466ms  Here: NSR with sinus arrhythmia, rightward axis, QTc 474ms  Labs:   Lab Results  Component Value Date   WBC 5.4 08/10/2016   HGB 10.6 (L) 08/10/2016   HCT 34.3 (L) 08/10/2016   MCV 80.0 08/10/2016   PLT 207 08/10/2016   No results for input(s): NA, K, CL, CO2, BUN, CREATININE, CALCIUM, PROT, BILITOT, ALKPHOS, ALT, AST, GLUCOSE in the last 168 hours.  Invalid input(s): LABALBU No results for input(s): CKTOTAL, CKMB, TROPONINI in the last 72 hours. Troponin (Point of Care Test) No results for input(s): TROPIPOC in the last 72 hours.  No results found for: CHOL, HDL, LDLCALC, TRIG No results found for: DDIMER  Labs at [facility]: Na 137, K 3.9, Mag 1.2, trop <0.01, LFTs unremarkable, Cr 0.65  Radiology/Studies:  No results found.    ASSESSMENT AND PLAN:   1. Arrhythmia: afib--->WCT which is probably VT (vs SVT w/ aberrant conduction). Baseline QTc was prolonged at 476ms, and pt on at least two QT-prolonging meds at baseline. She also received zofran in the ED at [facility] was 1.2. This clinical scenario may have precipitated the arrhythmia. Will have EP see the pt in the AM. She is currently in NSR with QTc 417ms. Given the afib, I am going to go ahead w/ heparin IV gtt until the picture becomes more clear. Will hold sertraline and protonix.  2. GI illness: LFTs were  OK at OSH. May be viral GI bug. If sx continue, could pursue further w/u  3. HTN/dyslipidemia: cont home regimen  4. DM2: cont home regimen  5. Hypomagnesemia: pt did receive IV Mag in the midst of the WCT. Will recheck Mag here and supplement to >2.0. Keep K >4.0 as well.    Thank you for the opportunity to participate in the care of this very pleasant patient. Will follow. Please call w/ questions.   Rudean Curt, MD , [facility] 12/06/16 5:08 AM

## 2016-12-07 DIAGNOSIS — I4891 Unspecified atrial fibrillation: Secondary | ICD-10-CM | POA: Diagnosis not present

## 2016-12-07 DIAGNOSIS — I481 Persistent atrial fibrillation: Secondary | ICD-10-CM | POA: Diagnosis not present

## 2016-12-07 DIAGNOSIS — Z23 Encounter for immunization: Secondary | ICD-10-CM | POA: Diagnosis not present

## 2016-12-07 DIAGNOSIS — E785 Hyperlipidemia, unspecified: Secondary | ICD-10-CM | POA: Diagnosis not present

## 2016-12-07 DIAGNOSIS — I10 Essential (primary) hypertension: Secondary | ICD-10-CM | POA: Diagnosis not present

## 2016-12-07 LAB — BASIC METABOLIC PANEL
Anion gap: 8 (ref 5–15)
BUN: 19 mg/dL (ref 6–20)
CO2: 23 mmol/L (ref 22–32)
CREATININE: 0.8 mg/dL (ref 0.44–1.00)
Calcium: 8.5 mg/dL — ABNORMAL LOW (ref 8.9–10.3)
Chloride: 109 mmol/L (ref 101–111)
Glucose, Bld: 144 mg/dL — ABNORMAL HIGH (ref 65–99)
POTASSIUM: 4.3 mmol/L (ref 3.5–5.1)
SODIUM: 140 mmol/L (ref 135–145)

## 2016-12-07 LAB — MAGNESIUM: MAGNESIUM: 1.7 mg/dL (ref 1.7–2.4)

## 2016-12-07 LAB — GLUCOSE, CAPILLARY: GLUCOSE-CAPILLARY: 147 mg/dL — AB (ref 65–99)

## 2016-12-07 MED ORDER — MUPIROCIN 2 % EX OINT: 1.0000 "application " | TOPICAL_OINTMENT | Freq: Two times a day (BID) | CUTANEOUS | 0 refills | Status: AC

## 2016-12-07 MED ORDER — APIXABAN 5 MG PO TABS: 5.0000 mg | ORAL_TABLET | Freq: Two times a day (BID) | ORAL | 3 refills | Status: AC

## 2016-12-07 NOTE — Discharge Summary (Signed)
ELECTROPHYSIOLOGY PROCEDURE DISCHARGE SUMMARY    Patient ID: Janet Lawson,  MRN: [id number], DOB/AGE: 63-26-1957 63 y.o.  Admit date: 12/06/2016 Discharge date: 12/07/2016  Primary Care Physician: System, Provider Not In Primary Cardiologist: Dr. Lorenza Chick   Primary Discharge Diagnosis:  1. N/V/D 2. Persistent Afib, RVR     CHA2DS2Vasc is at least 3, started on Eliquis 3. Suspect sleep apnea  Secondary Discharge Diagnosis:  1. DM 2. HTN 3. RLS  Allergies  Allergen Reactions  . Celebrex [Celecoxib]   . Sulfa Antibiotics      Procedures This Admission:  None at [facility]   Brief HPI: Janet Lawson is a 61 y.o. female sought attention initially at [facility] ER for 24-48hours of intractable N/V, loose stools, given IV Zofran, while there she developed acute onset of palpitations noted AFib RVR, woken by pounding sensation of her heart beat with WCT without syncope, ultimatly DCCV to SR.  Her mag was 1.2 and she received 2G at [facility] Course:  The patient was transferred to [facility] and admitted to ICU, EP was consulted, Dr. Caryl Comes suspected WVT to be rapid AFib with irregularity and proceeded by AF.  While here she had replacement of her K+ as well.  An echo was done noting normal LVEF without WMA and no significant VHD, this further making rapid AFib to be the likely rhythm of her WCT.  The patient ad no associated syncope.  And denies any hx of syncope.  She had mild Trop elevation, no reports of CP, good exertional capacity reported without limitations, and normal LVEF, felt 2/2 RVR/DCCV. She did report a couple years back palpitations, evaluated by cardiology only finding HTN at that time.  She does have hx of same GI issues intermittently and follows with GI MD.  To note, her N/V/D remained resolved since the ER.  The patient prefers to have her cardiology f/u closer to  Home and will see her prior cardiologist Dr. Lorenza Chick as well as her PMD, recommended within week for both.   Her Mag/K+ likely 2/2 GI loss, and has been replaced.  She will continue her home dose Mag and atenolol unchanged.  I have advised to avoid Mobic given her Eliquis and to discuss with her PMD possible alternative.  She is instructed to complete 4 additional days nasal Bactroban and educated as to why.  Her HR/rhythm has remained stable, SB 40's overnight 50's otherwise with PACs.  Dr. Caryl Comes introduced the possibility that she may require PPM if she has further problems with RVR, and importance of her cardiology f/u.  Also discussed suspicion of sleep apnea and importance of this to follow up with her PMD for evaluation into this as well.  The patient was examined and considered stable for discharge to home.    Physical Exam: Vitals:   12/07/16 0600 12/07/16 0700 12/07/16 0738 12/07/16 0800  BP: 127/61 115/73  (!) 109/52  Pulse: (!) 52 (!) 53  (!) 57  Resp: 11 20  (!) 21  Temp:   97.9 F (36.6 C)   TempSrc:   Oral   SpO2: 97% 97%  96%  Weight:      Height:        GEN- The patient is well appearing, alert and oriented x 3 today.   HEENT: normocephalic, atraumatic; sclera clear, conjunctiva pink; hearing intact; oropharynx clear; neck supple, no JVP Lungs- CTA b/l Heart- RRR, no murmurs, rubs or gallops, PMI not laterally displaced GI-  soft, non-tender, non-distended Extremities- no clubbing, cyanosis, or edema MS- no significant deformity or atrophy Skin- warm and dry, psoriasis patches LE b/l Psych- euthymic mood, full affect Neuro- no gross deficits   Labs:   Lab Results  Component Value Date   WBC 5.4 08/10/2016   HGB 10.6 (L) 08/10/2016   HCT 34.3 (L) 08/10/2016   MCV 80.0 08/10/2016   PLT 207 08/10/2016     Recent Labs Lab 12/06/16 0623 12/07/16 0536  NA 139 140  K 3.4* 4.3  CL 106 109  CO2 23 23  BUN 15 19  CREATININE 0.83 0.80  CALCIUM 8.8* 8.5*  PROT 6.9  --   BILITOT 0.9  --   ALKPHOS 56  --   ALT 14  --   AST 24  --   GLUCOSE 149* 144*    Discharge  Medications:  Allergies as of 12/07/2016      Reactions   Celebrex [celecoxib]    Sulfa Antibiotics       Medication List    STOP taking these medications   aspirin EC 81 MG tablet     TAKE these medications   apixaban 5 MG Tabs tablet Commonly known as:  ELIQUIS Take 1 tablet (5 mg total) by mouth 2 (two) times daily.   atenolol 25 MG tablet Commonly known as:  TENORMIN Take 25 mg by mouth daily.   CALCIUM 600+D3 PO Take 1 tablet by mouth 2 (two) times daily.   CINNAMON PO Take 2 capsules by mouth daily. 1000 mg per cap   Co-Enzyme Q-10 100 MG Caps Take 100 mg by mouth daily.   CULTURELLE DIGESTIVE HEALTH PO Take 1 capsule by mouth at bedtime.   Fish Oil 1000 MG Caps Take 1 capsule by mouth daily.   gabapentin 400 MG capsule Commonly known as:  NEURONTIN Take 400 mg by mouth 3 (three) times daily.   JENTADUETO XR 07-998 MG Tb24 Generic drug:  Linagliptin-Metformin HCl ER Take 1 tablet by mouth at bedtime.   losartan 50 MG tablet Commonly known as:  COZAAR Take 50 mg by mouth daily.   magnesium oxide 400 MG tablet Commonly known as:  MAG-OX Take 400 mg by mouth daily.   Melatonin 5 MG Tabs Take 5 mg by mouth at bedtime.   meloxicam 15 MG tablet Commonly known as:  MOBIC Take 15 mg by mouth daily. Notes to patient:  Avid regular use with Eliquis medication, please discuss with your primary doctor possible alternative   mupirocin ointment 2 % Commonly known as:  BACTROBAN Place 1 application into the nose 2 (two) times daily.   pantoprazole 40 MG tablet Commonly known as:  PROTONIX TAKE 1 TABLET (40 MG TOTAL) BY MOUTH DAILY.   pioglitazone 15 MG tablet Commonly known as:  ACTOS Take 15 mg by mouth 2 (two) times daily.   sertraline 50 MG tablet Commonly known as:  ZOLOFT Take 50 mg by mouth 2 (two) times daily.   simvastatin 20 MG tablet Commonly known as:  ZOCOR Take 20 mg by mouth at bedtime.   Turmeric 500 MG Caps Take 1 capsule by  mouth daily.            Discharge Care Instructions        Start     Ordered   12/07/16 0000  apixaban (ELIQUIS) 5 MG TABS tablet  2 times daily    Question:  Supervising Provider  Answer:  Thompson Grayer   12/07/16 1021  12/07/16 0000  mupirocin ointment (BACTROBAN) 2 %  2 times daily    Question:  Supervising Provider  Answer:  Thompson Grayer   12/07/16 1021   12/07/16 0000  Increase activity slowly     12/07/16 1021   12/07/16 0000  Diet - low sodium heart healthy     12/07/16 1021      Disposition: Home Discharge Instructions    Diet - low sodium heart healthy    Complete by:  As directed    Increase activity slowly    Complete by:  As directed        Duration of Discharge Encounter: Greater than 30 minutes including physician time.  Venetia Night, PA-C 12/07/2016 10:24 AM

## 2016-12-07 NOTE — Care Management Obs Status (Signed)
[place] NOTIFICATION   Patient Details  Name: Janet Lawson MRN: [id number] Date of Birth: 05-05-1955   Medicare Observation Status Notification Given:  Yes    Carles Collet, RN 12/07/2016, 11:13 AM

## 2016-12-07 NOTE — Care Management CC44 (Signed)
Condition Code 44 Documentation Completed  Patient Details  Name: MARIDEE SLAPE MRN: [id number] Date of Birth: [birth year]   Condition Code 44 given:  Yes Patient signature on Condition Code 44 notice:  Yes Documentation of 2 MD's agreement:  Yes Code 44 added to claim:  Yes    Carles Collet, RN 12/07/2016, 11:13 AM

## 2016-12-07 NOTE — Care Management Note (Signed)
Case Management Note  Patient Details  Name: Janet Lawson MRN: [id number] Date of Birth: [birth year]  Subjective/Objective:  From home with spouse, presents with WCT, new afib, dleep d/o breathing, htn, DM, Trop elevated. Patient for dc today, no needs.                  Action/Plan: NCM will follow for dc needs.   Expected Discharge Date:  12/07/16               Expected Discharge Plan:  Home/Self Care  In-House Referral:     Discharge planning Services  CM Consult  Post Acute Care Choice:    Choice offered to:     DME Arranged:    DME Agency:     HH Arranged:    HH Agency:     Status of Service:  Completed, signed off  If discussed at [employer] of Stay Meetings, dates discussed:    Additional Comments:  Zenon Mayo, RN 12/07/2016, 11:27 AM

## 2016-12-07 NOTE — Progress Notes (Signed)
Dc instructions given to patient at this time.  Pt verbalized understanding of all instructions.  Pt in NSR.  No s/s of any acute distress.

## 2016-12-14 ENCOUNTER — Encounter: Payer: Self-pay | Admitting: Nurse Practitioner

## 2017-01-10 ENCOUNTER — Ambulatory Visit: Payer: Medicare Other | Admitting: Gastroenterology

## 2017-02-02 ENCOUNTER — Ambulatory Visit: Payer: Medicare Other | Admitting: Nurse Practitioner

## 2017-02-21 ENCOUNTER — Ambulatory Visit: Payer: Medicare Other | Admitting: Nurse Practitioner

## 2017-04-14 ENCOUNTER — Ambulatory Visit: Payer: Medicare Other | Admitting: Nurse Practitioner

## 2017-06-01 ENCOUNTER — Encounter: Payer: Self-pay | Admitting: Nurse Practitioner

## 2017-06-01 ENCOUNTER — Ambulatory Visit: Payer: Medicare Other | Admitting: Nurse Practitioner

## 2017-06-01 VITALS — BP 109/58 | HR 59 | Temp 97.5°F | Ht 64.0 in | Wt 201.4 lb

## 2017-06-01 DIAGNOSIS — K219 Gastro-esophageal reflux disease without esophagitis: Secondary | ICD-10-CM | POA: Diagnosis not present

## 2017-06-01 DIAGNOSIS — R935 Abnormal findings on diagnostic imaging of other abdominal regions, including retroperitoneum: Secondary | ICD-10-CM

## 2017-06-01 NOTE — Patient Instructions (Signed)
1. Stop taking Protonix for now. 2. I am giving you samples of Dexilant 60 mg.  Take this once a day, 30 minutes before your first meal the day. 3. In 1-2 weeks, call us and let us know if it is helping or if it is not. 4. Follow-up in 2 months to discuss your heartburn symptoms and to recheck your labs.  We can consider possibly doing an ultrasound at that time as well.  Good to see you!  Enjoy the Sunshine (and the new knee)!!    At [facility] we value your feedback. You may receive a survey about your visit today. Please share your experience as we strive to create trusing relationships with our patients to provide genuine, compassionate, quality care.

## 2017-06-01 NOTE — Progress Notes (Signed)
cc'ed to pcp °

## 2017-06-01 NOTE — Progress Notes (Signed)
Referring Provider: No ref. provider found Primary Care Physician:  System, Provider Not In Primary GI:  Dr. Gala Romney  Chief Complaint  Patient presents with  . Gastroesophageal Reflux    indegestion last evenings  . abnormal liver    HPI:   Janet Lawson is a 62 y.o. female who presents for follow-up on abnormal abdominal MRI.  Patient was last seen in our office 08/04/2016 for the same.  He was he had intermittent transaminitis.  Previously had an MRI showing fibrosis without cirrhosis unchanged since 2012 and likely due to chronic hepatic fibrosis which was noted to be present on a PET scan in 2012.  No definite changes of portal hypertension.  She was previously recommended to undergo liver biopsy and initially declined.  She eventually did undergo liver biopsy and pathology showed fibrosis, no excessive iron, no alpha-1 antitrypsin, overall nonspecific with the appearance of likely being caused by medications such as methotrexate, which was the initial presumed etiology.  Likely ongoing liver damage due to being off methotrexate.  Her last visit she was doing okay.  Essentially no GI symptoms or hepatic symptoms.  Recommended repeat labs, try to come off statin and check enzymes 1 month afterward with subsequent consideration of rechallenge.  Follow-up in 6 months.  CMP completed 08/10/2016 which showed normal LFTs, normal INR, mildly anemia consistent with baseline.  Another repeat CMP in the interim also with normal LFTs.  As an aside, colonoscopy up-to-date 05/29/2014 which found melanosis coli, colonic diverticulosis.  Recommend repeat exam in 5 years (2021).  EGD completed the same day was normal other than gastric erosions status post biopsy.  Recommended continue Prilosec, gallbladder ultrasound.  Surgical pathology found the biopsies to be mild chronic gastritis and benign fundic gland polyp.  The patient was seen in the emergency department at [facility] and transferred to [place] for cardiology consult.  She was being seen for intractable nausea and vomiting, loose stools and developed A. fib RVR with a heart rate of 232.  She had electrolyte replacement after transfer.  Cardiac echo found normal LVEF.  No associated syncope.  Denies chest pain.  Since the ER her nausea vomiting and diarrhea resolved.  She was started on anticoagulation with Eliquis due to CHADSVASC score of at least 3.  Noted possibility she may eventually require permanent pacemaker if further RVR problems.  Recommend she follow-up with cardiologist in [place], [place] closer to home.  Today she states she's doing well. She followed-up with cardiology. Attempted control with BP medications and Eliquis. Seeing cardiology every 3-6 months. No further RVR recurrence. Having worsening late-day GERD exacerbation. Takes omeprazole in the morning, has esophageal burning and regurgitation later in the day/evening; occurs 4 out of 7 nights a week. Symptoms were gradual in onset. Denies other Abdominal Pain, N/V, hematochezia, melena, fever, unintentional weight loss. Alka Seltzer helps some (she checked to ensure it doesn't have NSAIDs). Sleeping on a wedge and 2 pillows. Denies yellowing of skin/eyes, darkened urine, tremors, acute episodic confusion. Denies chest pain, dyspnea, dizziness, lightheadedness, syncope, near syncope. Denies any other upper or lower GI symptoms.  Past Medical History:  Diagnosis Date  . Adenomatous polyp of colon 11/2009   recommended repeat in 5 years  . Breast CA ([place]) 2010 (L) and 2012 (R)   Bilateral  . 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 Complication of anesthesia   . Diabetes type 2, controlled ([place])   . Diverticulosis   .  Duodenal ulcer   . Gastric ulcer   . GERD (gastroesophageal reflux disease)   . Hiatal hernia   . HTN (hypertension)   . Hypercholesteremia   . Melanoma ([place])   . OA (osteoarthritis)    feets, hands, knees, back.    Marland Kitchen PONV (postoperative nausea and vomiting)     Past Surgical History:  Procedure Laterality Date  . BREAST LUMPECTOMY Bilateral 2010 (L) & 2012 (R)  . COLONOSCOPY  11/2009   1 descending colon adenomatous polyp; repeat in 5 years  . COLONOSCOPY N/A 05/29/2014   OXB:[id number] coli/colonic diverticulosis  . ESOPHAGOGASTRODUODENOSCOPY N/A 05/29/2014   AST:[id number] erosions s/p bx  . ESOPHAGOGASTRODUODENOSCOPY ENDOSCOPY  2001   Had stomach polyps (per patient); unable to find procedure notes.  . INCONTINENCE SURGERY    . KNEE ARTHROSCOPY    . SUPERFICIAL LYMPH NODE BIOPSY / EXCISION Bilateral    Bilateral breast associated    Current Outpatient Medications  Medication Sig Dispense Refill  . apixaban (ELIQUIS) 5 MG TABS tablet Take 1 tablet (5 mg total) by mouth 2 (two) times daily. 60 tablet 3  . atenolol (TENORMIN) 25 MG tablet Take 25 mg by mouth daily.     . Calcium Carb-Cholecalciferol (CALCIUM 600+D3 PO) Take 1 tablet by mouth daily.     Marland Kitchen Co-Enzyme Q-10 100 MG CAPS Take 100 mg by mouth daily.    . Ferrous Fumarate (HEMOCYTE - 106 MG FE) 324 (106 Fe) MG TABS tablet Take 1 tablet by mouth daily.    Marland Kitchen gabapentin (NEURONTIN) 400 MG capsule Take 400 mg by mouth 3 (three) times daily.     . Lactobacillus-Inulin ([place] PO) Take 1 capsule by mouth 2 (two) times daily.     Marland Kitchen losartan (COZAAR) 50 MG tablet Take 50 mg by mouth daily.     . magnesium oxide (MAG-OX) 400 MG tablet Take 400 mg by mouth daily.    . meloxicam (MOBIC) 15 MG tablet Take 15 mg by mouth daily.     . Omega-3 Fatty Acids (FISH OIL) 1000 MG CAPS Take 1 capsule by mouth daily.    . pantoprazole (PROTONIX) 40 MG tablet TAKE 1 TABLET (40 MG TOTAL) BY MOUTH DAILY. 90 tablet 3  . Semaglutide (OZEMPIC [place]) Inject into the skin. Once a week    . sertraline (ZOLOFT) 50 MG tablet Take 50 mg by mouth 2 (two) times daily.   1  . simvastatin (ZOCOR) 20 MG tablet Take 20 mg by mouth at bedtime.       No current facility-administered medications for this visit.     Allergies as of 06/01/2017 - Review Complete 06/01/2017  Allergen Reaction Noted  . Celebrex [celecoxib]  02/24/2011  . Sulfa antibiotics  02/24/2011    Family History  Problem Relation Age of Onset  . Heart disease Mother   . High blood pressure Mother   . Diabetes Mellitus II Mother   . Colon cancer Neg Hx     Social History   Socioeconomic History  . Marital status: Married    Spouse name: None  . Number of children: None  . Years of education: None  . Highest education level: None  Social Needs  . Financial resource strain: None  . Food insecurity - worry: None  . Food insecurity - inability: None  . Transportation needs - medical: None  . Transportation needs - non-medical: None  Occupational History  . None  Tobacco Use  . Smoking status: Never Smoker  .  Smokeless tobacco: Never Used  Substance and Sexual Activity  . Alcohol use: No    Alcohol/week: 0.0 oz  . Drug use: No  . Sexual activity: None  Other Topics Concern  . None  Social History Narrative  . None    Review of Systems: General: Negative for anorexia, weight loss, fever, chills, fatigue, weakness. ENT: Negative for hoarseness, difficulty swallowing. CV: Negative for chest pain, angina, palpitations, peripheral edema.  Respiratory: Negative for dyspnea at rest, cough, sputum, wheezing.  GI: See history of present illness. MS: Improved left knee pain s/p TKA..  Derm: Negative for rash or itching.  Neuro: Negative for memory loss, confusion.  Endo: Negative for unusual weight change.  Heme: Negative for bruising or bleeding. Allergy: Negative for rash or hives.   Physical Exam: BP (!) 109/58   Pulse (!) 59   Temp (!) 97.5 F (36.4 C) (Oral)   Ht 5\' 4"  (1.626 m)   Wt 201 lb 6.4 oz (91.4 kg)   BMI 34.57 kg/m  General:   Alert and oriented. Pleasant and cooperative. Well-nourished and well-developed.  Eyes:  Without  icterus, sclera clear and conjunctiva pink.  Ears:  Normal auditory acuity. Cardiovascular:  S1, S2 present without murmurs appreciated. Extremities without clubbing or edema. Respiratory:  Clear to auscultation bilaterally. No wheezes, rales, or rhonchi. No distress.  Gastrointestinal:  +BS, soft, non-tender and non-distended. No HSM noted. No guarding or rebound. No masses appreciated.  Rectal:  Deferred  Musculoskalatal:  Symmetrical without gross deformities. Neurologic:  Alert and oriented x4;  grossly normal neurologically. Psych:  Alert and cooperative. Normal mood and affect. Heme/Lymph/Immune: No excessive bruising noted.    06/01/2017 2:58 PM   Disclaimer: This note was dictated with voice recognition software. Similar sounding words can inadvertently be transcribed and may not be corrected upon review.

## 2017-06-01 NOTE — Assessment & Plan Note (Signed)
Final disposition on her abnormal liver status post biopsy found fibrosis, does not appear to have overt cirrhosis.  She would likely benefit from once a year lab monitoring and ultrasound.  She has progression of her disease and we can discuss increased surveillance.  At this time it is been almost 1 year since her last visit.  I will have her follow-up in 2 months related to her GERD, as per above, and we can recheck labs and consider possible right upper quadrant ultrasound at that time.

## 2017-06-01 NOTE — Assessment & Plan Note (Signed)
Gradual onset of worsening GERD symptoms.  Protonix has worked for a while.  She is tried multiple other PPIs.  At this point I will have her stop Protonix, start Dexilant.  I will provide samples for 2 weeks and request a progress report in 1-2 weeks.  Follow-up in 2 months.

## 2017-06-16 ENCOUNTER — Encounter: Payer: Self-pay | Admitting: Nurse Practitioner

## 2017-07-04 ENCOUNTER — Encounter: Payer: Self-pay | Admitting: Internal Medicine

## 2017-08-10 ENCOUNTER — Ambulatory Visit: Payer: Medicare Other | Admitting: Nurse Practitioner

## 2017-08-29 ENCOUNTER — Other Ambulatory Visit: Payer: Self-pay | Admitting: Nurse Practitioner

## 2017-08-29 DIAGNOSIS — K219 Gastro-esophageal reflux disease without esophagitis: Secondary | ICD-10-CM

## 2017-12-05 ENCOUNTER — Ambulatory Visit: Payer: Medicare Other | Admitting: Nurse Practitioner

## 2018-04-22 IMAGING — US US BIOPSY
1 series · 9 of 9 positions shown · non-contrast
Comparison: CT abdomen and pelvis - 02/01/2016

INDICATION: Elevated LFTs of uncertain etiology. Please perform random liver
biopsy for tissue diagnostic purposes.

EXAM:
ULTRASOUND GUIDED LIVER BIOPSY

[Series 1: us biopsy · 0.23mm/px · 9 acquisitions, 9 frames shown]
[im 1/9]
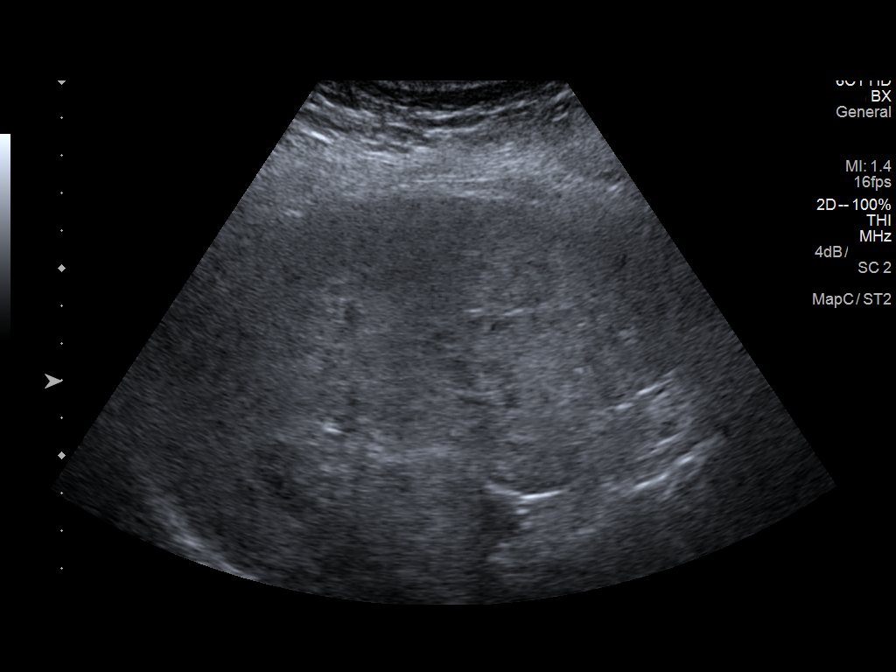
[im 2/9]
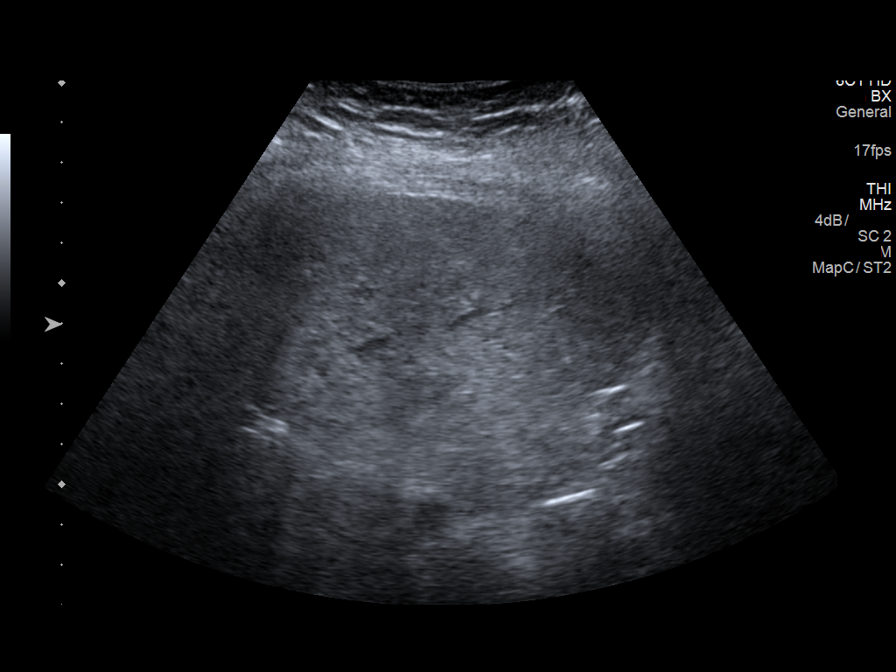
[im 3/9]
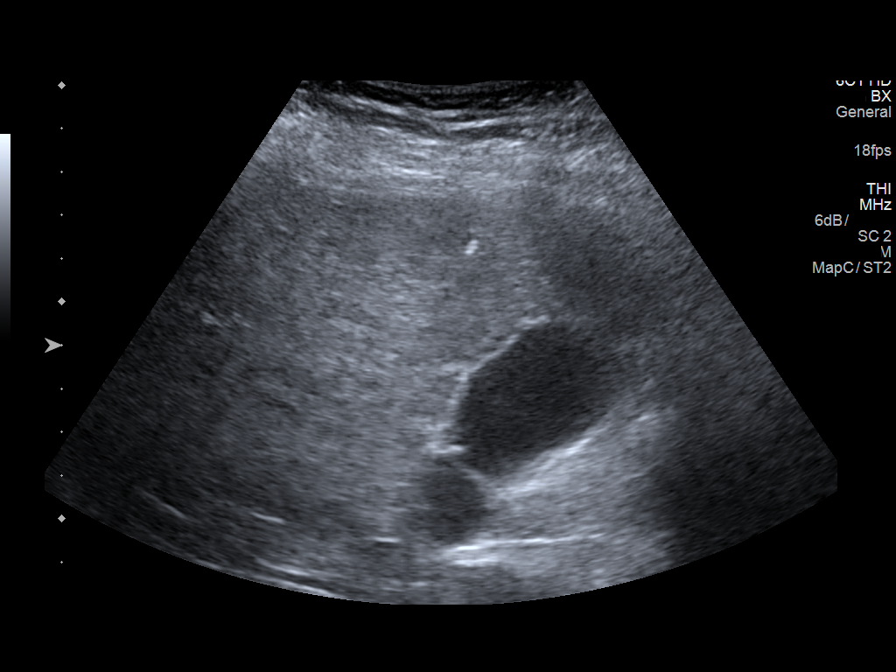
[im 4/9]
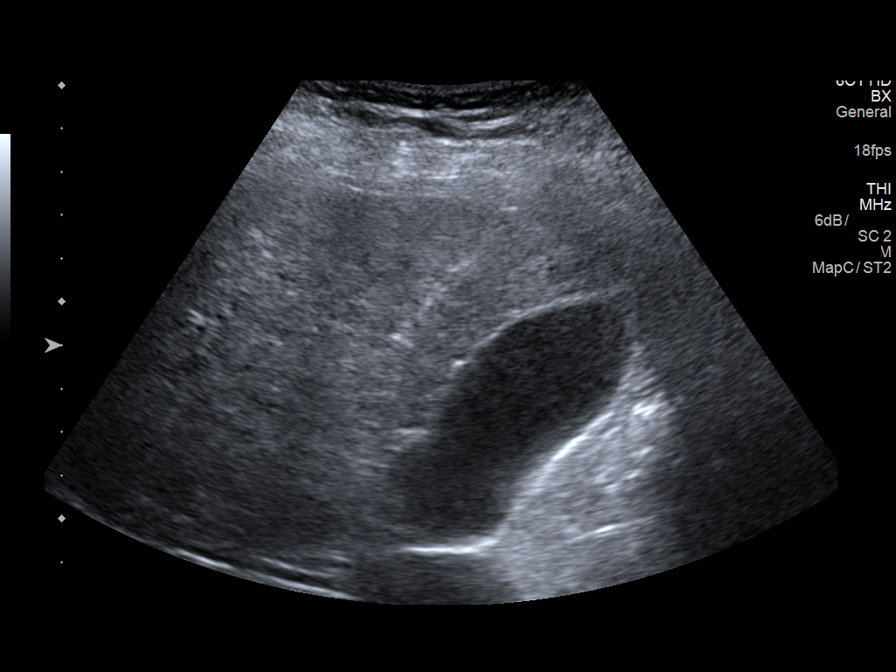
[im 5/9]
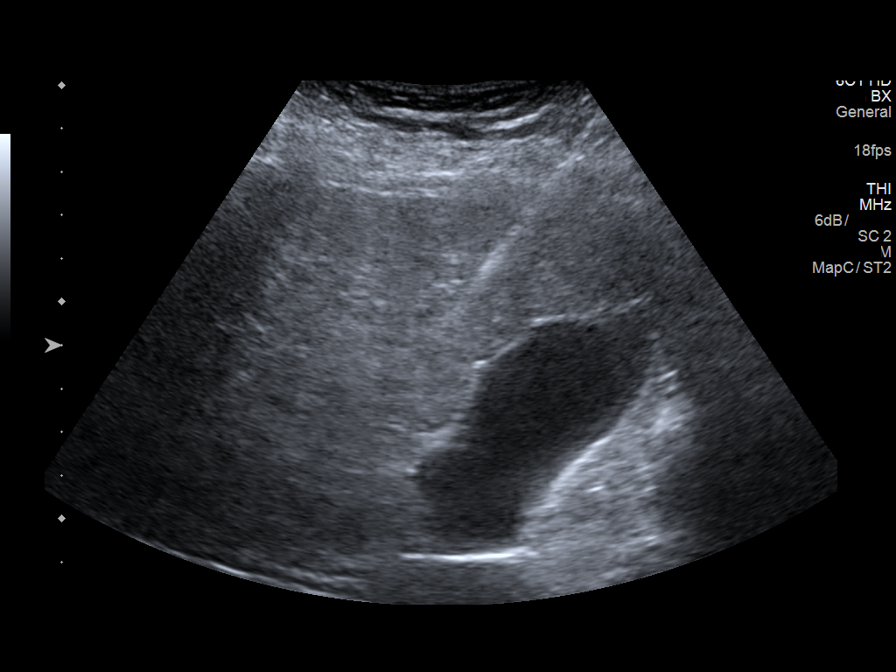
[im 6/9]
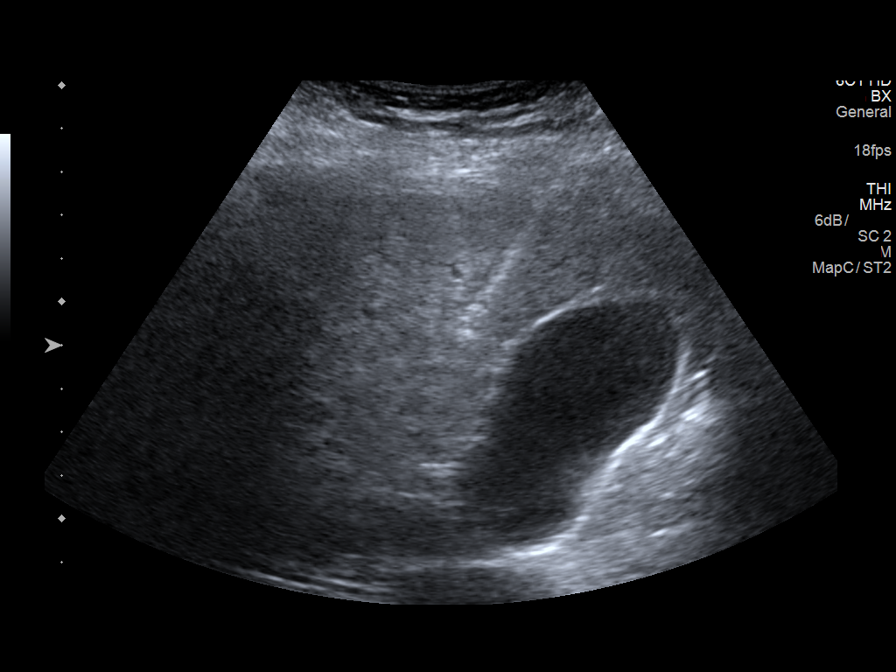
[im 7/9]
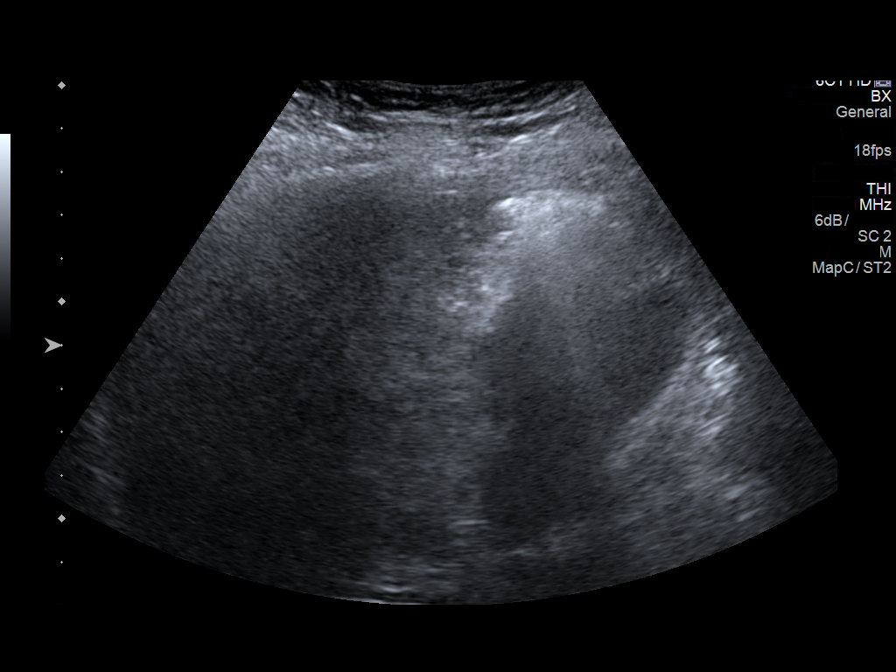
[im 8/9]
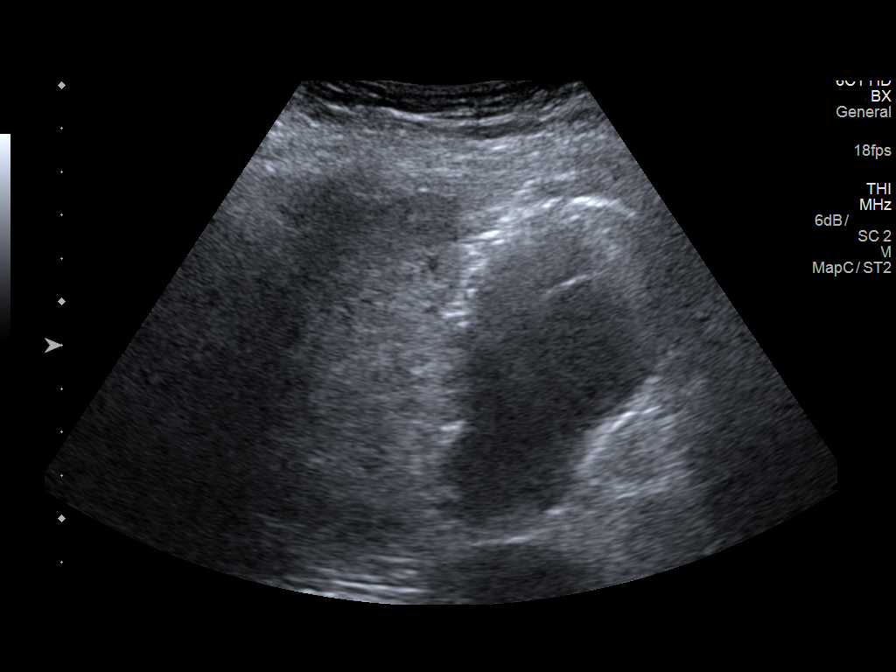
[im 9/9]
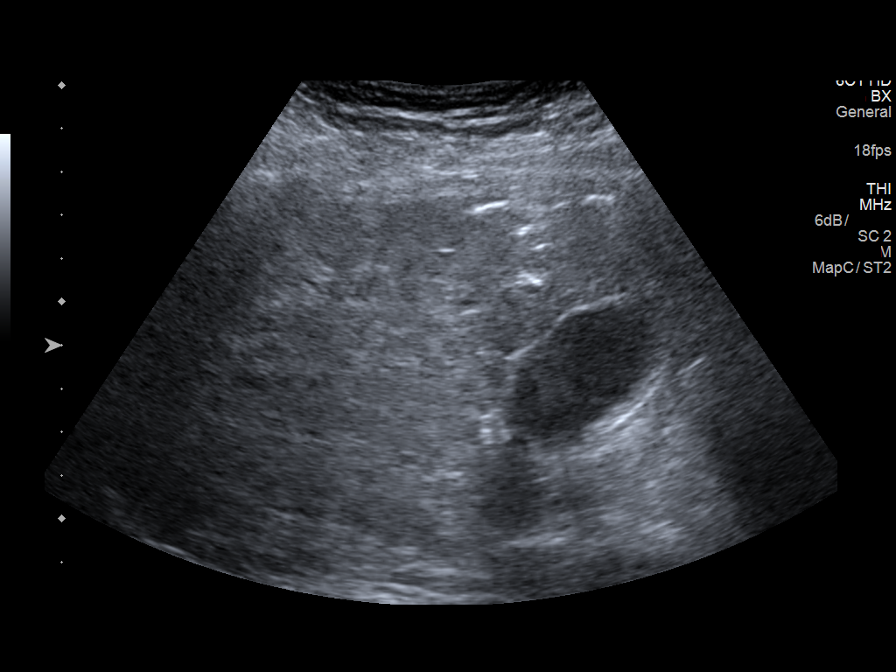

[9 of 9 positions shown; findings below may reference images not displayed]

MEDICATIONS:
None

ANESTHESIA/SEDATION:
Fentanyl 50 mcg IV; Versed 1 mg IV

Total Moderate Sedation time: 10 minutes; The patient was
continuously monitored during the procedure by the interventional
radiology nurse under my direct supervision.

COMPLICATIONS:
None immediate.

PROCEDURE:
Informed written consent was obtained from the patient after a
discussion of the risks, benefits and alternatives to treatment. The
patient understands and consents the procedure. A timeout was
performed prior to the initiation of the procedure.

Ultrasound scanning was performed of the right upper abdominal
quadrant and the procedure was planned. The right upper abdomen was
prepped and draped in the usual sterile fashion. The overlying soft
tissues were anesthetized with 1% lidocaine with epinephrine. A 17
gauge, 6.8 cm co-axial needle was advanced into a peripheral aspect
of the right lobe of the liver and 3 core biopsies were obtained
with an 18 gauge core device under direct ultrasound guidance.

The co-axial needle track was embolized with the administration of a
Gel-Foam slurry. Superficial hemostasis was obtained with manual
compression. Post procedural scanning was negative for definitive
area of hemorrhage. A dressing was placed. The patient tolerated the
procedure well without immediate post procedural complication.
IMPRESSION: Technically successful ultrasound guided liver biopsy.

## 2018-04-24 ENCOUNTER — Ambulatory Visit: Payer: Medicare Other | Admitting: Nurse Practitioner

## 2018-05-31 ENCOUNTER — Ambulatory Visit: Payer: Medicare Other | Admitting: Nurse Practitioner

## 2018-08-09 ENCOUNTER — Other Ambulatory Visit: Payer: Self-pay | Admitting: Gastroenterology

## 2018-08-09 DIAGNOSIS — K219 Gastro-esophageal reflux disease without esophagitis: Secondary | ICD-10-CM

## 2018-10-24 ENCOUNTER — Encounter: Payer: Self-pay | Admitting: Gastroenterology

## 2018-10-24 NOTE — Progress Notes (Signed)
Referring Provider: Bretta Bang, MD Primary Care Physician: Dr. Bretta Bang Primary GI Physician: Dr. Gala Romney  Chief Complaint  Patient presents with  . Blood In Stools    tested +, hasn't seen blood    HPI:   Janet Lawson is a 63 y.o. female with past medical history significant for a. Fib with history of RVR in 2018 on Eliquis, HTN, HLD, T2DM, breast cancer, GERD, PUD, fibrosis of the liver, and adenomatous colon polyp in 2011.   She is presenting today at the request of Dr. Malvin Johns for blood in the stool. Last colonoscopy March 2016 with melanosis coli and colonic dierticulosis. Recommended repeat in 5 years (2021). EGD at the same time with normal esophagus. Hiatal hernia. Gastric polyps. Gastric erosions. Status post biopsy. Pathology with mild chronic gastritis and benign fundic gland polyp.   Patient was last seen in our office on 06/01/17 for follow-up of GERD and abnormal abdominal MRI. GERD symptoms were worsening at that time. Protonix was switched to [place]. Patient later called and said she found out it was her diabetes medication Ozempic causing her symptoms. Since stopping that medication, she had no trouble. She stopped Dexilant and went back to Protonix. Regarding abnormal MRI, patient had fibrosis of the liver noted on MRI. Patient ultimately underwent liver biopsy which found fibrosis, no excessive iron, no alpha-1 antitrypsin, overall nonspecific with the appearance of likely being caused by medications such as methotrexate. She had no overt signs of cirrhosis at that time. Plans to recheck labs and consider [place] in 2 months at follow-up, but patient has not been seen since.    Today she states she tested positive for blood in her stool on 10/17/18. Patient states her PCP panicked after she had a large bruise on her left arm.  Thinks the detection of blood could be coming from her diverticulosis. She has not seen any bright red blood in her stool. She does have black  stools but is on iron. Patient states her diverticulosis will flare up every now and then and will have a lot of pain in her lower abdomen, mostly on the left.  Otherwise, she usually will have minimal LLQ discomfort that is present about all the time. 1/10 on pain scale. "I just know it is there." Reports her PCP has empirically treated her in the past for diverticulitis. This was about 1 year ago. BMs daily. No constipation. No diarrhea. No straining.  No breakthrough GERD symptoms. No indigestion or burping. No upper abdominal pain. No dysphagia. Protonix is working very well. Takes Meloxicam daily. No other NSAIDs. Does take tylenol at times.   Denies fever, chills, fatigue, lightheadedness, unintentional weight loss, confusion, yellowing of skin, swelling in lower extremities or abdomen, chest pain, palpitations, shortness of breath, or cough.   Currently taking 1/2 dose Eliquis until she sees PCP again.   Past Medical History:  Diagnosis Date  . Adenomatous polyp of colon 11/2009   recommended repeat in 5 years  . Atrial fibrillation with RVR ([place])   . Breast CA ([place]) 2010 (L) and 2012 (R)   Bilateral  . 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 Complication of anesthesia   . Diabetes type 2, controlled ([place])   . Diverticulosis   . Duodenal ulcer   . Gastric ulcer   . GERD (gastroesophageal reflux disease)   . Hiatal hernia   . HTN (hypertension)   . Hypercholesteremia   . Melanoma ([place])   .  OA (osteoarthritis)    feets, hands, knees, back.  Marland Kitchen PONV (postoperative nausea and vomiting)   . Psoriasis     Past Surgical History:  Procedure Laterality Date  . BREAST LUMPECTOMY Bilateral 2010 (L) & 2012 (R)  . COLONOSCOPY  11/2009   1 descending colon adenomatous polyp; repeat in 5 years  . COLONOSCOPY N/A 05/29/2014   PRF:[id number] coli/colonic diverticulosis  . ESOPHAGOGASTRODUODENOSCOPY N/A 05/29/2014   DJT:[id number]/TJ/[id number] erosions s/p bx  .  ESOPHAGOGASTRODUODENOSCOPY ENDOSCOPY  2001   Had stomach polyps (per patient); unable to find procedure notes.  . INCONTINENCE SURGERY    . KNEE ARTHROSCOPY    . SUPERFICIAL LYMPH NODE BIOPSY / EXCISION Bilateral    Bilateral breast associated    Current Outpatient Medications  Medication Sig Dispense Refill  . apixaban (ELIQUIS) 5 MG TABS tablet Take 1 tablet (5 mg total) by mouth 2 (two) times daily. 60 tablet 3  . atenolol (TENORMIN) 25 MG tablet Take 25 mg by mouth daily.     . Calcium Carb-Cholecalciferol (CALCIUM 600+D3 PO) Take 1 tablet by mouth daily.     Marland Kitchen Co-Enzyme Q-10 100 MG CAPS Take 100 mg by mouth daily.    . Ferrous Fumarate (HEMOCYTE - 106 MG FE) 324 (106 Fe) MG TABS tablet Take 1 tablet by mouth daily.    Marland Kitchen gabapentin (NEURONTIN) 800 MG tablet Take 1 tablet by mouth 3 (three) times daily.    Marland Kitchen glipiZIDE (GLUCOTROL XL) 10 MG 24 hr tablet Take 1 tablet by mouth 2 (two) times daily.    Marland Kitchen JANUMET XR [phone] MG TB24 Take 1 tablet by mouth daily.    . Lactobacillus-Inulin ([place] PO) Take 1 capsule by mouth 2 (two) times daily.     Marland Kitchen LIVALO 2 MG TABS Take 1 tablet by mouth daily.    Marland Kitchen losartan (COZAAR) 50 MG tablet Take 50 mg by mouth daily.     . magnesium oxide (MAG-OX) 400 MG tablet Take 400 mg by mouth daily.    . meloxicam (MOBIC) 15 MG tablet Take 15 mg by mouth daily.     . Omega-3 Fatty Acids (FISH OIL) 1000 MG CAPS Take 1 capsule by mouth daily.    . pantoprazole (PROTONIX) 40 MG tablet TAKE ONE (1) TABLET BY MOUTH EVERY DAY  90 tablet 3  . sertraline (ZOLOFT) 50 MG tablet Take 50 mg by mouth daily.   1  . vitamin B-12 (CYANOCOBALAMIN) 1000 MCG tablet Take 1,000 mcg by mouth 3 (three) times a week. Takes Sun, Tue, Thurs     No current facility-administered medications for this visit.     Allergies as of 10/25/2018 - Review Complete 10/25/2018  Allergen Reaction Noted  . Celebrex [celecoxib]  02/24/2011  . Sulfa antibiotics  02/24/2011     Family History  Problem Relation Age of Onset  . Heart disease Mother   . High blood pressure Mother   . Diabetes Mellitus II Mother   . Colon cancer Neg Hx     Social History   Socioeconomic History  . Marital status: Married    Spouse name: Not on file  . Number of children: Not on file  . Years of education: Not on file  . Highest education level: Not on file  Occupational History  . Not on file  Social Needs  . Financial resource strain: Not on file  . Food insecurity    Worry: Not on file    Inability: Not on file  .  Transportation needs    Medical: Not on file    Non-medical: Not on file  Tobacco Use  . Smoking status: Never Smoker  . Smokeless tobacco: Never Used  Substance and Sexual Activity  . Alcohol use: No    Alcohol/week: 0.0 standard drinks  . Drug use: No  . Sexual activity: Not on file  Lifestyle  . Physical activity    Days per week: Not on file    Minutes per session: Not on file  . Stress: Not on file  Relationships  . Social Herbalist on phone: Not on file    Gets together: Not on file    Attends religious service: Not on file    Active member of club or organization: Not on file    Attends meetings of clubs or organizations: Not on file    Relationship status: Not on file  Other Topics Concern  . Not on file  Social History Narrative  . Not on file    Review of Systems: Gen: See HPI.  CV: See HPI Resp: See HPI GI: See HPI. Derm: Denies rash, itching, dry skin Psych: Denies depression, anxiety  Heme: See HPI  Physical Exam: BP 131/77   Pulse (!) 57   Temp (!) 96.8 F (36 C) (Temporal)   Ht 5\' 4"  (1.626 m)   Wt 196 lb 9.6 oz (89.2 kg)   BMI 33.75 kg/m  General:   Alert and oriented. No distress noted. Pleasant and cooperative.  Head:  Normocephalic and atraumatic. Eyes:  Conjuctiva clear without scleral icterus. Heart:  S1, S2 present without murmurs appreciated. Lungs:  Clear to auscultation bilaterally. No  wheezes, rales, or rhonchi. No distress.  Abdomen:  +BS, soft, non-tender and non-distended. No rebound or guarding. No HSM or masses noted. Msk:  Symmetrical without gross deformities. Normal posture. Extremities:  Without edema. Skin: Large bruise that is resolving on her upper left arm and bruise on right arm in antecubital area from recent blood work.  Neurologic:  Alert and  oriented x4 Psych: Normal mood and affect.

## 2018-10-25 ENCOUNTER — Encounter: Payer: Self-pay | Admitting: Gastroenterology

## 2018-10-25 ENCOUNTER — Other Ambulatory Visit: Payer: Self-pay

## 2018-10-25 ENCOUNTER — Ambulatory Visit: Payer: Medicare Other | Admitting: Gastroenterology

## 2018-10-25 ENCOUNTER — Encounter: Payer: Self-pay | Admitting: *Deleted

## 2018-10-25 VITALS — BP 131/77 | HR 57 | Temp 96.8°F | Ht 64.0 in | Wt 196.6 lb

## 2018-10-25 DIAGNOSIS — R195 Other fecal abnormalities: Secondary | ICD-10-CM | POA: Diagnosis not present

## 2018-10-25 DIAGNOSIS — K7400 Hepatic fibrosis, unspecified: Secondary | ICD-10-CM | POA: Insufficient documentation

## 2018-10-25 DIAGNOSIS — K219 Gastro-esophageal reflux disease without esophagitis: Secondary | ICD-10-CM | POA: Diagnosis not present

## 2018-10-25 NOTE — Patient Instructions (Addendum)
We will get you scheduled for colonoscopy with possible endoscopy with Dr. Gala Romney in near future.   We will touch base with Dr. Donn Pierini to get the ok to hold eliquis 48 hours prior to procedure.   Hold iron x 7 days prior to procedure.   Diabetes medication adjustments the day before procedure:1/2 dose of glipizide morning and evening and 1/2 dose of Janumet  Hold diabetes medications the day of procedure.   I have also placed an order to have ultrasound of your liver at [facility]. They will call with time and date.    We will plan to see you back after your procedure.   Aliene Altes, PA-C [facility]

## 2018-10-26 ENCOUNTER — Telehealth: Payer: Self-pay

## 2018-10-26 DIAGNOSIS — K7400 Hepatic fibrosis, unspecified: Secondary | ICD-10-CM

## 2018-10-26 NOTE — Telephone Encounter (Signed)
Pt called office, she wants to have her [place] at [facility] [place] instead of [facility].  Cyril Mourning, encounter form said RUQ [place] at [facility]. Is it ok for her to have it done at [place]?

## 2018-10-26 NOTE — Telephone Encounter (Signed)
I have faxed Dr. Donn Pierini and requested to advise on HOLDING ELIQUIS X 48 HOURS PRIOR TO PROCEDURE.

## 2018-10-26 NOTE — Telephone Encounter (Signed)
That should be fine as long as they fax the results.

## 2018-10-26 NOTE — Telephone Encounter (Signed)
Called [employer], [place] at [facility] cancelled.   [place] order faxed to [facility]. Pt is aware they will call her to schedule appt.

## 2018-10-27 DIAGNOSIS — R195 Other fecal abnormalities: Secondary | ICD-10-CM | POA: Insufficient documentation

## 2018-10-27 NOTE — Assessment & Plan Note (Addendum)
64 y.o. female with fibrosis of the liver. Liver biopsy in 2018 due to abnormal liver on MRI in 2016. Biopsy with portal fibrosis and mild steatosis. No excessive iron, no alpha-1 antitrypsin, overall nonspecific with the appearance of likely being caused by medications such as methotrexate which she is no longer on. She has no overt signs of cirrhosis. Recent labs with her PCP on 10/17/18 with AST 30, ALT 29, Alk phos 101. Last US of the abdomen in 2016. Will update [place] at this time.

## 2018-10-27 NOTE — Assessment & Plan Note (Signed)
Chronic. Well controlled without breakthrough symptoms. She did have heme positive stools on 10/17/18 which is addressed below. No other alarm symptoms at this time.   Continue Protonix 40 mg daily.

## 2018-10-27 NOTE — Assessment & Plan Note (Signed)
63 y.o. female who has past medical history of atrial fibrillation on Eliquis who recently tested heme positive on 10/17/18 after seeing her PCP for a large bruise on her upper left arm. Patient has not seen any bright red blood in her stool. Stools are black on iron. Admits to minimal discomfort in her LLQ that is always present. 1/10. No other upper or lower GI symptoms at this time. No unintentional weight loss. Benign abdominal exam today. She does take Meloxicam daily. No other NSAIDs. Recent lab work on 10/17/18 with PCP with hemoglobin 13.6 and platelets 190. This is dairly stable from 09/06/18 with hemoglobin 14.2 and platelets 159. Last colonoscopy March 2016 with melanosis coli and colonic dierticulosis. Recommended repeat in 5 years (2021). EGD at the same time with normal esophagus. Hiatal hernia. Gastric polyps. Gastric erosions. Pathology with mild chronic gastritis and benign fundic gland polyp.  DDX: Colon polyps vs AVMs vs gastritis in the setting of meloxicam daily. Less likely malignancy.   Proceed with TCS +/- EGD with propofol with Dr. Gala Romney in the near future. Propofol due to amount of medication needed during last TCS. The risks, benefits, and alternatives have been discussed in detail with patient. They have stated understanding and desire to proceed.  We will touch base with Dr. Donn Pierini to get the okay to hold Eliquis 48 hours prior to procedure.  Hold iron x 7 days prior to procedure.  Diabetes medication adjustments the day before procedure:1/2 dose of glipizide morning and evening and 1/2 dose of Janumet Hold diabetes medications the day of procedure Follow-up after procedure

## 2018-11-02 ENCOUNTER — Other Ambulatory Visit (HOSPITAL_COMMUNITY): Payer: Medicare Other

## 2018-11-08 NOTE — Telephone Encounter (Signed)
Noted we will call patient once we receive schedule.

## 2018-11-08 NOTE — Telephone Encounter (Signed)
Received the clearance and forwarding to [place].  Dr. Sondra Come stated the patient is at low risk from cardiac standpoint to proceed with endoscopy.  She should HOLD ELIQUIS x 48 hours prior to procedure and resume it 24 hours after the procedure.   Forwarding to [facility] also.

## 2018-11-08 NOTE — Telephone Encounter (Signed)
Faxed 2nd request

## 2018-11-08 NOTE — Telephone Encounter (Signed)
Noted. Agree with plan.

## 2018-11-13 ENCOUNTER — Telehealth: Payer: Self-pay | Admitting: *Deleted

## 2018-11-13 NOTE — Telephone Encounter (Signed)
Called patient to schedule TCS +/-EGD with propofol with RMR. She wanted to know which prep she is going to be given. I advised her normally with her insurance they cover trylite (gallon jug). She states she knows she can't do this and moviprep made her very sick as well. I advised could send in low volume prep but can't guarantee insurance will pay. We also don't have any sample preps in office. She states she discussed at her OV with [facility] that she wants to do dulcolax pill and mix miralax with gatorade. She stated her spouse did this prep recently for a TCS he did in [place] and she wants to do the same thing. Please advise

## 2018-11-14 ENCOUNTER — Other Ambulatory Visit: Payer: Self-pay | Admitting: *Deleted

## 2018-11-14 DIAGNOSIS — R195 Other fecal abnormalities: Secondary | ICD-10-CM

## 2018-11-14 DIAGNOSIS — K7400 Hepatic fibrosis, unspecified: Secondary | ICD-10-CM

## 2018-11-14 NOTE — Telephone Encounter (Signed)
Further recommendations to follow

## 2018-11-14 NOTE — Telephone Encounter (Addendum)
Rourk, Cristopher Estimable, MD  Roselyn Reef; [place], CMA; Hassan Rowan, LPN        Thanks. I am okay with a Ducolax and MiraLAX prep but she needs a Linzess 290 x 3 days before the procedure. History of melanosis coli. Need to ensure a decent prep.   ----  Called patient. She is aware okay for ducolax/miralax prep. She is also aware she will need to take linzess 213mch 3 days prior. We currently do not have any samples. Patient aware will call once we do. Patient scheduled for procedures 01/22/2019 at 10:30am. Aware will mail instructions along with her pre-op/covid-19 testing appt. Orders entered. She is also aware to hold iron 7 days prior, eliquis 48 hrs prior.   [employer] and spoke with [place]. Was advised no PA is required for TCS/EGD. Ref# [id number]

## 2018-12-25 NOTE — Telephone Encounter (Signed)
Sample of Linzess 290 mcg box left up front for pt to pick up. Pt is aware that she should have 1 pill left over in the box. Pt is to take 1 pill qam x 3 days prior to her procedure.   Janet Lawson, pt wanted to check to see if her procedure results came in from [place]?

## 2018-12-28 NOTE — Telephone Encounter (Signed)
It's scanned into her chart.

## 2019-01-02 ENCOUNTER — Telehealth: Payer: Self-pay | Admitting: *Deleted

## 2019-01-02 NOTE — Telephone Encounter (Signed)
Pt called and wants to cancel her procedure due to Covid.  She did not want to reschedule at this time.  Pt advised to call us back when ready to reschedule her procedure.

## 2019-01-02 NOTE — Telephone Encounter (Signed)
Noted  

## 2019-01-02 NOTE — Telephone Encounter (Signed)
LMOVM to inform endo scheduler to cancel procedure.  FYI to [facility].

## 2019-01-18 ENCOUNTER — Other Ambulatory Visit (HOSPITAL_COMMUNITY): Payer: Medicare Other

## 2019-01-22 ENCOUNTER — Encounter (HOSPITAL_COMMUNITY): Payer: Self-pay

## 2019-01-22 ENCOUNTER — Ambulatory Visit (HOSPITAL_COMMUNITY): Admit: 2019-01-22 | Payer: Medicare Other | Admitting: Internal Medicine

## 2019-01-22 SURGERY — COLONOSCOPY WITH PROPOFOL
Anesthesia: Monitor Anesthesia Care

## 2019-02-12 ENCOUNTER — Telehealth: Payer: Self-pay | Admitting: Gastroenterology

## 2019-02-12 NOTE — Telephone Encounter (Signed)
I have received and reviewed patient's right upper quadrant ultrasound that we had ordered due to her history of liver fibrosis.  Exam dated 12/05/2018 at [place] center.   Liver continues to be mildly echogenic and heterogenous in appearance.  This was suspected in the setting of her liver fibrosis and was also present in 2016.  Her liver does have a mildly nodular border which can be seen in cirrhosis.  She did have some mild nodularity on her MRI in 2018.  Is hard to tell whether this is worsening or stable.   I do not want to label patient as having cirrhosis at this point; however, I do want to err on the side of caution and follow this closely.  I would like to have her repeat an abdominal ultrasound with elastography in March 2021.  This will be a 62-month interval.  At that time, I would also like to have her complete CBC, CMP, INR, and AFP.   At this time, she does not have a follow-up appointment.  She was scheduled for procedures but canceled and did not want to reschedule at that time.  We do need to at least go ahead and make a follow-up appointment for March or Early April 2021 after she has the above imaging and labs completed.   If she would like to schedule an earlier appointment to get her procedures rescheduled, we can do so.  Alicia: Can you let patient know of above recommendations and arrange for labs and [place]?  Stacey: Patient will need to have follow-up appointment after her labs and imaging in March. Appointment in late March or early April 2021.

## 2019-02-13 ENCOUNTER — Encounter: Payer: Self-pay | Admitting: Internal Medicine

## 2019-02-13 NOTE — Telephone Encounter (Signed)
Lmom, waiting on a return call.  

## 2019-02-13 NOTE — Telephone Encounter (Signed)
PATIENT SCHEDULED AND LETTER SENT  °

## 2019-02-19 NOTE — Telephone Encounter (Signed)
Mailing letter to pt

## 2019-06-14 ENCOUNTER — Ambulatory Visit: Payer: Medicare Other | Admitting: Gastroenterology

## 2019-07-18 ENCOUNTER — Other Ambulatory Visit: Payer: Self-pay

## 2019-07-18 ENCOUNTER — Encounter: Payer: Self-pay | Admitting: Nurse Practitioner

## 2019-07-18 ENCOUNTER — Ambulatory Visit: Payer: Medicare Other | Admitting: Nurse Practitioner

## 2019-07-18 VITALS — BP 135/78 | HR 69 | Temp 97.1°F | Ht 64.0 in | Wt 209.8 lb

## 2019-07-18 DIAGNOSIS — K219 Gastro-esophageal reflux disease without esophagitis: Secondary | ICD-10-CM

## 2019-07-18 DIAGNOSIS — R195 Other fecal abnormalities: Secondary | ICD-10-CM

## 2019-07-18 DIAGNOSIS — K7400 Hepatic fibrosis, unspecified: Secondary | ICD-10-CM

## 2019-07-18 NOTE — Assessment & Plan Note (Signed)
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  Ultrasound and MRI previously with questionable nodular border.  At this point we will recheck labs including CBC, CMP, INR, AFP.  I will also add fiber sure to try to definitively decide if she has "cirrhosis" or not.  Further recommendations to follow.  Follow-up in 6 months.

## 2019-07-18 NOTE — Patient Instructions (Signed)
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 Have your labs completed when you are able to 2. Because of the specific labs were ordering, these will need to be completed at [facility] (not [facility] or [facility]) 3. We will call you when we get results 4. Call us if you notice any concerning symptoms  GERD (reflux/heartburn): 1. I am glad you are doing better on Protonix 2. Call us if you have any worsening symptoms  Previous blood in the stool: 1. We will hold off on colonoscopy and endoscopy now to you can get the possible breast cancer sorted out 2. We will rediscuss at your follow-up visit if there is any projected timing to complete these 3. Call us if you have any worsening or significant bleeding  Overall I recommend:  1. Continue your other current medications 2. Return for follow-up in 6 months 3. Call us if you have any questions or concerns   ---------------------------------------------------------------  I am glad you have gotten your COVID-19 vaccination!  Even though you are fully vaccinated you should continue to wear a mask, socially distance, and wash your hands frequently.  ---------------------------------------------------------------   At [facility] we value your feedback. You may receive a survey about your visit today. Please share your experience as we strive to create trusting relationships with our patients to provide genuine, compassionate, quality care.  We appreciate your understanding and patience as we review any laboratory studies, imaging, and other diagnostic tests that are ordered as we care for you. Our office policy is 5 business days for review of these results, and any emergent or urgent results are addressed in a timely manner for your best interest. If you do not hear from our office in 1 week, please contact us.   We also encourage the use of MyChart, which contains your  medical information for your review as well. If you are not enrolled in this feature, an access code is on this after visit summary for your convenience. Thank you for allowing [place] to be involved in your care.  It was great to see you today!  I hope you have a great Summer!!

## 2019-07-18 NOTE — Assessment & Plan Note (Signed)
GERD doing well on Protonix daily.  No breakthrough.  Continue to monitor and notify us of any worsening.  Follow-up in 6 months.

## 2019-07-18 NOTE — Progress Notes (Signed)
Referring Provider: No ref. provider found Primary Care Physician:  System, Provider Not In Primary GI:  Dr. Gala Romney  Chief Complaint  Patient presents with  . Gastroesophageal Reflux    doing ok    HPI:   Janet Lawson is a 64 y.o. female who presents for follow-up.  The patient was last seen in our office 10/25/2018 for GERD, liver fibrosis, heme positive stool.  Previous GERD switched from Protonix to [place] but did not find her diabetes medication was causing her symptoms and since stopping that she has been able to go back to Protonix.  Fibrosis of the liver noted on MRI, ultimately underwent liver biopsy which found fibrosis with no excessive iron, no alpha-1 antitrypsin, overall nonspecific with the appearance of likely being caused by medications such as methotrexate.  No overt signs of cirrhosis.  At her last visit she noted heme positive stool 10/17/2018 for primary care.  No obvious bright red blood per rectum.  She does have black stools but she is on iron.  Has diverticulosis which flares every now and then and causes abdominal pain.  Empirically treated for diverticulitis in the past by primary care.  GERD well managed at that time.  No other overt GI complaints appearing taking half dose Eliquis until primary care follow-up.  Recommended colonoscopy with possible EGD, right upper quadrant ultrasound, follow-up post procedure.  Colonoscopy/EGD initially scheduled for 01/22/2019 but was canceled by the patient due to Covid numbers.  Right upper quadrant ultrasound completed in [place], [place].  Noted mild echogenicity and heterogenous appearance with a mildly nodular border which can be seen in cirrhosis.  Did have some mild nodularity on MRI and hard to tell if this is worsening or stable.  Recommended repeat ultrasound with elastography in March 2021 (37-month interval) as well as labs including CBC, CMP, INR, AFP.  Today she states she's doing well overall. Denies GERD  symptoms, Protonix is doing great for her. No breakthrough symptoms. Takes it once daily. Denies abdominal pain, N/V, hematochezia, melena (has dark stools on iron), fever, chills, unintentional weight loss. Denies any yellowing of the skin/eyes, darkened urine, acute episodic confusion, generalized tremors, generalized pruritis. Denies URI or flu-like symptoms. Denies loss of sense of taste or smell. She has had both COVID-19 vaccines. Denies chest pain, dyspnea, dizziness, lightheadedness, syncope, near syncope. Denies any other upper or lower GI symptoms.  She is not wanting to reschedule colonoscopy/EGD because she has a lot going on right now. She was told she may have recurrent breast cancer.  Past Medical History:  Diagnosis Date  . Adenomatous polyp of colon 11/2009   recommended repeat in 5 years  . Atrial fibrillation with RVR ([facility])   . Breast CA ([place]) 2010 (L) and 2012 (R)   Bilateral  . 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 Complication of anesthesia   . Diabetes type 2, controlled ([place])   . Diverticulosis   . Duodenal ulcer   . Gastric ulcer   . GERD (gastroesophageal reflux disease)   . Hiatal hernia   . HTN (hypertension)   . Hypercholesteremia   . Melanoma ([place])   . OA (osteoarthritis)    feets, hands, knees, back.  Marland Kitchen PONV (postoperative nausea and vomiting)   . Psoriasis     Past Surgical History:  Procedure Laterality Date  . BREAST LUMPECTOMY Bilateral 2010 (L) & 2012 (R)  . COLONOSCOPY  11/2009   1 descending colon adenomatous polyp;  repeat in 5 years  . COLONOSCOPY N/A 05/29/2014   [id number] coli/colonic diverticulosis  . ESOPHAGOGASTRODUODENOSCOPY N/A 05/29/2014   [id number] erosions s/p bx  . ESOPHAGOGASTRODUODENOSCOPY ENDOSCOPY  2001   Had stomach polyps (per patient); unable to find procedure notes.  . INCONTINENCE SURGERY    . KNEE ARTHROSCOPY    . SUPERFICIAL LYMPH NODE BIOPSY / EXCISION Bilateral     Bilateral breast associated    Current Outpatient Medications  Medication Sig Dispense Refill  . apixaban (ELIQUIS) 5 MG TABS tablet Take 1 tablet (5 mg total) by mouth 2 (two) times daily. 60 tablet 3  . Calcium Carbonate (CALCIUM 600 PO) Take 1,200 mg by mouth daily.    . cetirizine (ZYRTEC) 10 MG tablet Take 10 mg by mouth daily.    Marland Kitchen Co-Enzyme Q-10 100 MG CAPS Take 100 mg by mouth daily.    . Ferrous Fumarate (HEMOCYTE - 106 MG FE) 324 (106 Fe) MG TABS tablet Take 1 tablet by mouth daily.    Marland Kitchen gabapentin (NEURONTIN) 800 MG tablet Take 1 tablet by mouth 3 (three) times daily.    Marland Kitchen glipiZIDE (GLUCOTROL XL) 10 MG 24 hr tablet Take 1 tablet by mouth 2 (two) times daily.    Marland Kitchen JANUMET XR [phone] MG TB24 Take 1 tablet by mouth daily.    Marland Kitchen LIVALO 2 MG TABS Take 1 tablet by mouth daily.    Marland Kitchen losartan (COZAAR) 50 MG tablet Take 50 mg by mouth daily.     . magnesium oxide (MAG-OX) 400 MG tablet Take 400 mg by mouth 2 (two) times daily.     . meloxicam (MOBIC) 15 MG tablet Take 15 mg by mouth daily.     . Multiple Vitamins-Minerals (CENTRUM SILVER PO) Take by mouth daily.    . Omega-3 Fatty Acids (FISH OIL) 1000 MG CAPS Take 1 capsule by mouth daily.    . pantoprazole (PROTONIX) 40 MG tablet TAKE ONE (1) TABLET BY MOUTH EVERY DAY  90 tablet 3  . sertraline (ZOLOFT) 50 MG tablet Take 50 mg by mouth daily.   1  . sotalol (BETAPACE) 80 MG tablet Take 80 mg by mouth 2 (two) times daily.    . vitamin B-12 (CYANOCOBALAMIN) 1000 MCG tablet Take 1,000 mcg by mouth 3 (three) times a week. Takes Sun, Tue, Thurs     No current facility-administered medications for this visit.    Allergies as of 07/18/2019 - Review Complete 07/18/2019  Allergen Reaction Noted  . Celebrex [celecoxib]  02/24/2011  . Sulfa antibiotics  02/24/2011    Family History  Problem Relation Age of Onset  . Heart disease Mother   . High blood pressure Mother   . Diabetes Mellitus II Mother   . Colon cancer Neg Hx     Social  History   Socioeconomic History  . Marital status: Married    Spouse name: Not on file  . Number of children: Not on file  . Years of education: Not on file  . Highest education level: Not on file  Occupational History  . Not on file  Tobacco Use  . Smoking status: Never Smoker  . Smokeless tobacco: Never Used  Substance and Sexual Activity  . Alcohol use: No    Alcohol/week: 0.0 standard drinks  . Drug use: No  . Sexual activity: Not on file  Other Topics Concern  . Not on file  Social History Narrative  . Not on file   Social Determinants of Health  Financial Resource Strain:   . Difficulty of Paying Living Expenses:   Food Insecurity:   . Worried About Charity fundraiser in the Last Year:   . Arboriculturist in the Last Year:   Transportation Needs:   . Film/video editor (Medical):   Marland Kitchen Lack of Transportation (Non-Medical):   Physical Activity:   . Days of Exercise per Week:   . Minutes of Exercise per Session:   Stress:   . Feeling of Stress :   Social Connections:   . Frequency of Communication with Friends and Family:   . Frequency of Social Gatherings with Friends and Family:   . Attends Religious Services:   . Active Member of Clubs or Organizations:   . Attends Archivist Meetings:   Marland Kitchen Marital Status:     Subjective: Review of Systems  Constitutional: Negative for chills, fever, malaise/fatigue and weight loss.  HENT: Negative for congestion and sore throat.   Respiratory: Negative for cough and shortness of breath.   Cardiovascular: Negative for chest pain and palpitations.  Gastrointestinal: Negative for abdominal pain, blood in stool, diarrhea, melena, nausea and vomiting.  Musculoskeletal: Negative for joint pain and myalgias.  Skin: Negative for rash.  Neurological: Negative for dizziness and weakness.  Endo/Heme/Allergies: Does not bruise/bleed easily.  Psychiatric/Behavioral: Negative for depression. The patient is not  nervous/anxious.   All other systems reviewed and are negative.    Objective: BP 135/78   Pulse 69   Temp (!) 97.1 F (36.2 C) (Temporal)   Ht 5\' 4"  (1.626 m)   Wt 209 lb 12.8 oz (95.2 kg)   BMI 36.01 kg/m  Physical Exam Vitals and nursing note reviewed.  Constitutional:      General: She is not in acute distress.    Appearance: Normal appearance. She is well-developed. She is obese. She is not ill-appearing, toxic-appearing or diaphoretic.  HENT:     Head: Normocephalic and atraumatic.     Nose: No congestion or rhinorrhea.  Eyes:     General: No scleral icterus. Cardiovascular:     Rate and Rhythm: Normal rate and regular rhythm.     Heart sounds: Normal heart sounds.  Pulmonary:     Effort: Pulmonary effort is normal. No respiratory distress.     Breath sounds: Normal breath sounds.  Abdominal:     General: Bowel sounds are normal.     Palpations: Abdomen is soft. There is no hepatomegaly, splenomegaly or mass.     Tenderness: There is no abdominal tenderness. There is no guarding or rebound.     Hernia: No hernia is present.  Skin:    General: Skin is warm and dry.     Coloration: Skin is not jaundiced.     Findings: No rash.  Neurological:     General: No focal deficit present.     Mental Status: She is alert and oriented to person, place, and time.  Psychiatric:        Attention and Perception: Attention normal.        Mood and Affect: Mood normal.        Speech: Speech normal.        Behavior: Behavior normal.        Thought Content: Thought content normal.        Cognition and Memory: Cognition and memory normal.       07/18/2019 2:20 PM   Disclaimer: This note was dictated with voice recognition software. Similar sounding words  can inadvertently be transcribed and may not be corrected upon review.

## 2019-07-18 NOTE — Assessment & Plan Note (Signed)
Previously documented heme positive stool.  Patient was initially scheduled for colonoscopy and endoscopy which she canceled because of the drastic rise in COVID-19 cases last fall/winter.  I discussed possibly proceeding at this time, but she informed me that she has been told she may have recurrent breast cancer and if so would need to undergo mastectomy.  She is wanting to hold off for now until she can get the possible breast cancer straightened out.  We will rebroach this topic in the future, depending on her clinical progress with breast cancer.  In the meantime I will check a CBC and iron studies at this time.  Follow-up in 6 months.

## 2019-07-21 ENCOUNTER — Other Ambulatory Visit: Payer: Self-pay | Admitting: Gastroenterology

## 2019-07-21 DIAGNOSIS — K219 Gastro-esophageal reflux disease without esophagitis: Secondary | ICD-10-CM

## 2019-07-31 ENCOUNTER — Other Ambulatory Visit: Payer: Self-pay

## 2019-07-31 DIAGNOSIS — R195 Other fecal abnormalities: Secondary | ICD-10-CM

## 2019-07-31 DIAGNOSIS — Z79899 Other long term (current) drug therapy: Secondary | ICD-10-CM

## 2019-08-01 LAB — IRON,TIBC AND FERRITIN PANEL
Ferritin: 195 ng/mL — ABNORMAL HIGH (ref 15–150)
Iron Saturation: 35 % (ref 15–55)
Iron: 100 ug/dL (ref 27–139)
Total Iron Binding Capacity: 282 ug/dL (ref 250–450)
UIBC: 182 ug/dL (ref 118–369)

## 2019-08-02 LAB — NASH FIBROSURE
ALPHA 2-MACROGLOBULINS, QN: 371 mg/dL — ABNORMAL HIGH (ref 110–276)
ALT (SGPT) P5P: 22 IU/L (ref 0–40)
AST (SGOT) P5P: 28 IU/L (ref 0–40)
Apolipoprotein A-1: 151 mg/dL (ref 116–209)
Bilirubin, Total: 0.3 mg/dL (ref 0.0–1.2)
Cholesterol, Total: 159 mg/dL (ref 100–199)
Fibrosis Score: 0.49 — ABNORMAL HIGH (ref 0.00–0.21)
GGT: 64 IU/L — ABNORMAL HIGH (ref 0–60)
Glucose: 121 mg/dL — ABNORMAL HIGH (ref 65–99)
Haptoglobin: 140 mg/dL (ref 37–355)
Height: 64 in
NASH Score: 0.75 — ABNORMAL HIGH
Steatosis Score: 0.68 — ABNORMAL HIGH (ref 0.00–0.30)
Triglycerides: 261 mg/dL — ABNORMAL HIGH (ref 0–149)
Weight: 205 [lb_av]

## 2019-08-02 LAB — CBC WITH DIFFERENTIAL/PLATELET
Basophils Absolute: 0.1 10*3/uL (ref 0.0–0.2)
Basos: 1 %
EOS (ABSOLUTE): 0.2 10*3/uL (ref 0.0–0.4)
Eos: 3 %
Hematocrit: 41.9 % (ref 34.0–46.6)
Hemoglobin: 13.4 g/dL (ref 11.1–15.9)
Immature Grans (Abs): 0 10*3/uL (ref 0.0–0.1)
Immature Granulocytes: 0 %
Lymphocytes Absolute: 1.6 10*3/uL (ref 0.7–3.1)
Lymphs: 22 %
MCH: 29.1 pg (ref 26.6–33.0)
MCHC: 32 g/dL (ref 31.5–35.7)
MCV: 91 fL (ref 79–97)
Monocytes Absolute: 0.5 10*3/uL (ref 0.1–0.9)
Monocytes: 8 %
Neutrophils Absolute: 4.8 10*3/uL (ref 1.4–7.0)
Neutrophils: 66 %
Platelets: 181 10*3/uL (ref 150–450)
RBC: 4.61 x10E6/uL (ref 3.77–5.28)
RDW: 13.3 % (ref 11.7–15.4)
WBC: 7.2 10*3/uL (ref 3.4–10.8)

## 2019-08-02 LAB — COMPREHENSIVE METABOLIC PANEL
ALT: 20 IU/L (ref 0–32)
AST: 23 IU/L (ref 0–40)
Albumin/Globulin Ratio: 1.5 (ref 1.2–2.2)
Albumin: 4.2 g/dL (ref 3.8–4.8)
Alkaline Phosphatase: 94 IU/L (ref 48–121)
BUN/Creatinine Ratio: 38 — ABNORMAL HIGH (ref 12–28)
BUN: 29 mg/dL — ABNORMAL HIGH (ref 8–27)
Bilirubin Total: 0.4 mg/dL (ref 0.0–1.2)
CO2: 24 mmol/L (ref 20–29)
Calcium: 9.2 mg/dL (ref 8.7–10.3)
Chloride: 107 mmol/L — ABNORMAL HIGH (ref 96–106)
Creatinine, Ser: 0.76 mg/dL (ref 0.57–1.00)
GFR calc Af Amer: 95 mL/min/{1.73_m2} (ref >59)
GFR calc non Af Amer: 82 mL/min/{1.73_m2} (ref >59)
Globulin, Total: 2.8 g/dL (ref 1.5–4.5)
Glucose: 119 mg/dL — ABNORMAL HIGH (ref 65–99)
Potassium: 4.9 mmol/L (ref 3.5–5.2)
Sodium: 144 mmol/L (ref 134–144)
Total Protein: 7 g/dL (ref 6.0–8.5)

## 2019-08-02 LAB — AFP TUMOR MARKER: AFP, Serum, Tumor Marker: 3.8 ng/mL (ref 0.0–8.3)

## 2019-08-02 LAB — PROTIME-INR
INR: 1.1 (ref 0.9–1.2)
Prothrombin Time: 11.7 s (ref 9.1–12.0)

## 2019-08-03 ENCOUNTER — Telehealth: Payer: Self-pay | Admitting: Emergency Medicine

## 2019-08-03 NOTE — Telephone Encounter (Signed)
error 

## 2019-10-15 ENCOUNTER — Encounter: Payer: Self-pay | Admitting: Nurse Practitioner

## 2019-11-01 ENCOUNTER — Encounter: Payer: Self-pay | Admitting: Genetic Counselor

## 2020-01-22 ENCOUNTER — Ambulatory Visit: Payer: Medicare Other | Admitting: Nurse Practitioner

## 2020-01-22 ENCOUNTER — Ambulatory Visit: Payer: Medicare Other | Admitting: Gastroenterology

## 2020-01-23 ENCOUNTER — Ambulatory Visit: Payer: Medicare Other | Admitting: Nurse Practitioner

## 2020-10-08 ENCOUNTER — Other Ambulatory Visit: Payer: Self-pay

## 2020-10-08 ENCOUNTER — Encounter: Payer: Self-pay | Admitting: Gastroenterology

## 2020-10-08 ENCOUNTER — Ambulatory Visit: Payer: Medicare Other | Admitting: Gastroenterology

## 2020-10-08 VITALS — BP 136/72 | HR 63 | Temp 97.5°F | Ht 64.0 in | Wt 214.6 lb

## 2020-10-08 DIAGNOSIS — K219 Gastro-esophageal reflux disease without esophagitis: Secondary | ICD-10-CM | POA: Diagnosis not present

## 2020-10-08 DIAGNOSIS — K7400 Hepatic fibrosis, unspecified: Secondary | ICD-10-CM

## 2020-10-08 NOTE — Patient Instructions (Signed)
Continue pantoprazole '40mg'$  daily before breakfast. New RX sent to pharmacy.  We will request copy of labs to have on file. Please have PCP forward future liver, iron, anemia labs to [place].  If you decide to pursue colonoscopy, let me know. Return to the office in one year or sooner if needed.

## 2020-10-08 NOTE — Progress Notes (Signed)
Primary Care Physician: System, Provider Not In  Primary Gastroenterologist:  Garfield Cornea, MD   Chief Complaint  Patient presents with   Gastroesophageal Reflux    Doing ok, needs refill    HPI: Janet Lawson is a 65 y.o. female here for follow-up.  Last seen in May 2021.  History of GERD, liver fibrosis. Fibrosis of the liver (2016) noted on MRI, ultimately underwent liver biopsy (2018) which found fibrosis with no excessive iron, no alpha-1 antitrypsin, overall nonspecific with the appearance of likely being caused by medications such as methotrexate.  No overt signs of cirrhosis.  EGD/TCS had been planned in 01/2019 for heme + stool but she postponed due to high covid numbers. At last visit she did not want to reschedule because she have breast biopsy pending (turned out benign).   She had Covid May 2022. Recovered fully. Her reflux is doing well on pantoprazole. BM 2-3 every day. No melena. Takes iron. No abdominal pain. No brbpr. No dysphagia. She states she is not interested in pursuing egd or colonoscopy at this time. She states she has would not be a candidate for further radiation treatment if she had colon cancer and never wants chemo again. We discussed that colonoscopy could actually prevent colon cancer, it is not just used to diagnose cancer.   Mildly nodular liver on u/s 11/2018. NASH fibrosure with F2.   Current Outpatient Medications  Medication Sig Dispense Refill   apixaban (ELIQUIS) 5 MG TABS tablet Take 1 tablet (5 mg total) by mouth 2 (two) times daily. 60 tablet 3   Calcium Carbonate (CALCIUM 600 PO) Take 1,200 mg by mouth daily.     cetirizine (ZYRTEC) 10 MG tablet Take 10 mg by mouth daily.     Co-Enzyme Q-10 100 MG CAPS Take 100 mg by mouth daily.     Ferrous Fumarate (HEMOCYTE - 106 MG FE) 324 (106 Fe) MG TABS tablet Take 1 tablet by mouth daily.     gabapentin (NEURONTIN) 800 MG tablet Take 1 tablet by mouth 3 (three) times daily.     glipiZIDE  (GLUCOTROL XL) 10 MG 24 hr tablet Take 1 tablet by mouth 2 (two) times daily.     JANUMET XR [phone] MG TB24 Take 1 tablet by mouth daily.     LIVALO 2 MG TABS Take 1 tablet by mouth daily.     losartan (COZAAR) 50 MG tablet Take 50 mg by mouth daily.      magnesium oxide (MAG-OX) 400 MG tablet Take 400 mg by mouth 2 (two) times daily.      meloxicam (MOBIC) 15 MG tablet Take 15 mg by mouth daily.      Omega-3 Fatty Acids (FISH OIL) 1000 MG CAPS Take 1 capsule by mouth daily.     pantoprazole (PROTONIX) 40 MG tablet Take 1 tablet (40 mg total) by mouth daily before breakfast. 90 tablet 3   sertraline (ZOLOFT) 50 MG tablet Take 50 mg by mouth 2 (two) times daily.  1   sotalol (BETAPACE) 80 MG tablet Take 80 mg by mouth 2 (two) times daily.     TALTZ 80 MG/ML SOAJ Inject 80 mg into the skin every 28 (twenty-eight) days.     vitamin B-12 (CYANOCOBALAMIN) 1000 MCG tablet Take 1,000 mcg by mouth 3 (three) times a week. Takes Sun, Tue, Thurs     Multiple Vitamins-Minerals (CENTRUM SILVER PO) Take by mouth daily. (Patient not taking: Reported on 10/08/2020)  No current facility-administered medications for this visit.    Allergies as of 10/08/2020 - Review Complete 10/08/2020  Allergen Reaction Noted   Celebrex [celecoxib]  02/24/2011   Sulfa antibiotics  02/24/2011    ROS:  General: Negative for anorexia, weight loss, fever, chills, fatigue, weakness. ENT: Negative for hoarseness, difficulty swallowing , nasal congestion. CV: Negative for chest pain, angina, palpitations, dyspnea on exertion, peripheral edema.  Respiratory: Negative for dyspnea at rest, dyspnea on exertion, cough, sputum, wheezing.  GI: See history of present illness. GU:  Negative for dysuria, hematuria, urinary incontinence, urinary frequency, nocturnal urination.  Endo: Negative for unusual weight change.    Physical Examination:   BP 136/72   Pulse 63   Temp (!) 97.5 F (36.4 C) (Temporal)   Ht '5\' 4"'$  (1.626 m)    Wt 214 lb 9.6 oz (97.3 kg)   BMI 36.84 kg/m   General: Well-nourished, well-developed in no acute distress.  Eyes: No icterus. Mouth: Oropharyngeal mucosa moist and pink , no lesions erythema or exudate. Lungs: Clear to auscultation bilaterally.  Heart: Regular rate and rhythm, no murmurs rubs or gallops.  Abdomen: Bowel sounds are normal, nontender, nondistended, no hepatosplenomegaly or masses, no abdominal bruits or hernia , no rebound or guarding.   Extremities: No lower extremity edema. No clubbing or deformities. Neuro: Alert and oriented x 4   Skin: Warm and dry, no jaundice.   Psych: Alert and cooperative, normal mood and affect.  Labs:  Lab Results  Component Value Date   CREATININE 0.76 07/31/2019   BUN 29 (H) 07/31/2019   NA 144 07/31/2019   K 4.9 07/31/2019   CL 107 (H) 07/31/2019   CO2 24 07/31/2019   Lab Results  Component Value Date   ALT 20 07/31/2019   AST 23 07/31/2019   ALKPHOS 94 07/31/2019   BILITOT 0.4 07/31/2019   Lab Results  Component Value Date   WBC 7.2 07/31/2019   HGB 13.4 07/31/2019   HCT 41.9 07/31/2019   MCV 91 07/31/2019   PLT 181 07/31/2019   Lab Results  Component Value Date   INR 1.1 07/31/2019   INR 1.1 08/10/2016   INR 1.07 05/20/2016     Imaging Studies: No results found.   Assessment:  Hepatic fibrosis: Initially abnormality noted on MRI 2016. Liver biopsy with fibrosis 2018, possibly due to medication (methotrexate). Last year noted F2 on Fibrosure testing. Will continue to monitor. Recommend yearly LFTs. Patient reports recent labs by PCP. We have requested for review.   GERD: doing well on pantoprazole '40mg'$  daily.   H/O heme + stool 2020: Patient declines work up for now. We discussed possibility of preventing colon cancer with early detection of polyps. Patient voiced understanding. She will let us decide if she wants to pursue.    Plan: Continue pantoprazole '40mg'$  daily. Retrieve copy of PCP labs.  Ov in  one year or call sooner if needed.

## 2020-10-24 ENCOUNTER — Other Ambulatory Visit: Payer: Self-pay | Admitting: Gastroenterology

## 2020-10-24 DIAGNOSIS — K219 Gastro-esophageal reflux disease without esophagitis: Secondary | ICD-10-CM

## 2020-10-29 ENCOUNTER — Telehealth: Payer: Self-pay | Admitting: Gastroenterology

## 2020-10-29 NOTE — Telephone Encounter (Signed)
Labs reviewed from June 2022: B12 946, vitamin D 46, free T4 1.02, magnesium 1.6, creatinine 0.99, albumin 3.1, total bilirubin 0.4, AST 23, ALT 29, alkaline phosphatase 93.  White blood cell count 5290, hemoglobin 12.2, platelets 195,000.   No additional recommendations.

## 2021-09-22 ENCOUNTER — Encounter: Payer: Self-pay | Admitting: Gastroenterology

## 2021-09-22 ENCOUNTER — Ambulatory Visit: Payer: Medicare Other | Admitting: Gastroenterology

## 2021-09-22 ENCOUNTER — Telehealth: Payer: Self-pay | Admitting: *Deleted

## 2021-09-22 ENCOUNTER — Encounter: Payer: Self-pay | Admitting: *Deleted

## 2021-09-22 VITALS — BP 129/73 | HR 65 | Temp 97.1°F | Ht 64.0 in | Wt 214.2 lb

## 2021-09-22 DIAGNOSIS — K7400 Hepatic fibrosis, unspecified: Secondary | ICD-10-CM | POA: Diagnosis not present

## 2021-09-22 DIAGNOSIS — R197 Diarrhea, unspecified: Secondary | ICD-10-CM | POA: Diagnosis not present

## 2021-09-22 DIAGNOSIS — R1013 Epigastric pain: Secondary | ICD-10-CM | POA: Diagnosis not present

## 2021-09-22 DIAGNOSIS — R1902 Left upper quadrant abdominal swelling, mass and lump: Secondary | ICD-10-CM | POA: Diagnosis not present

## 2021-09-22 DIAGNOSIS — R1030 Lower abdominal pain, unspecified: Secondary | ICD-10-CM | POA: Insufficient documentation

## 2021-09-22 NOTE — Telephone Encounter (Signed)
PA done via [employer] Medicare Decision Support Number: [phone] [place]

## 2021-09-22 NOTE — Patient Instructions (Signed)
We will have you go for labs and CT scan as discussed.  For your bowels: Add fiber supplement daily to see if this evens out your bowel movements.  Benefiber powder or chewables and Fiber Choice chewables are good options.  Take according to package instructions with goal of 3 to 4 g of fiber daily.  If persistent loose urgent stool, you can use Imodium 2 mg in the morning and an additional dose later in the day if needed.  Hold for constipation. Consider stopping probiotic or at least switching to a different brand that has a different bacteria than the 1 you have been using. Continue pantoprazole 40 mg daily. We will be in touch with results as available.  Further recommendation based on findings.  Call in the interim with any questions or concerns.

## 2021-09-22 NOTE — Progress Notes (Signed)
GI Office Note    Referring Provider: Bretta Bang, MD Primary Care Physician:  Bretta Bang, MD  Primary Gastroenterologist: Garfield Cornea, MD   Chief Complaint   Chief Complaint  Patient presents with   Abdominal Pain    Lower abdominal pain/cramping. Also having some pain in the left upper quadrant. States there is a bulge in the left upper quadrant. Thinks that her hiatal hernia and gallbladder may be having issues.     History of Present Illness   Janet Lawson is a 66 y.o. female presenting today for further evaluation of numerous GI complaints.  She was last seen in the office in July 2022.  History of GERD, liver fibrosis. Also with history of heme positive stools in 2020, EGD and colonoscopy have been planned but patient ultimately declined further evaluation.  She has history of adenomatous colon polyps in 2011.  No polyps follow-up colonoscopy in 2016.  Last EGD in 2016 with hiatal hernia, gastric erosions, benign fundic gland polyp and gastritis but no hpylori.  Fibrosis of the liver (2016) noted on MRI, ultimately underwent liver biopsy (2018) which found fibrosis with no excessive iron, no alpha-1 antitrypsin deposits, overall nonspecific with the appearance of likely being caused by medications such as methotrexate.  No overt signs of cirrhosis.  Mildly nodular liver on ultrasound in September 2020.  Karlene Lineman FibroSure with F2.  Today: Lower abd/pelvic cramping intermittent but when it happens its constant.  Generally last 1 day or so.  None today. Not necessarily related to stools.  She does note frequent stools.  No recent antibiotics.  3-4 bowel movements per day, sometimes urgent and cannot make it in times.  Sometimes wakes her up from sleep.  If she has to go out side of the house, she generally will take Imodium but then she may not have a bowel movement for 24 hours.  If she does not take Imodium, then she has to stay in the bathroom when she is out.  She  has tried probiotics.  She has been on magnesium as instructed by her cardiologist.  She did not tolerate 800 mg dose because of diarrhea, currently on 400 mg daily.    Complains of abdominal swelling, especially in the upper abdomen.  Increased belching.  She can burp 20 times in a row when she presses on her epigastric region.  Feels a lump in the left upper abdomen, first noticed 2 to 3 to 4 months ago.  Stable in size.  Hard to touch, tender.  She worries about recurrence of breast cancer.  She has had bilateral breast cancer, left breast cancer was metastatic.  To chemo and radiation for both times.  Also wonders if her upper GI symptoms are related to her gallbladder.  She states she had a flare in the past and almost got her gallbladder out but symptoms settled down and she did not have to have surgery.  This was around 2005.  States she is also taking ferrous fumarate, recommended by PCP.  Goes to have additional blood work in August.       Medications   Current Outpatient Medications  Medication Sig Dispense Refill   apixaban (ELIQUIS) 5 MG TABS tablet Take 1 tablet (5 mg total) by mouth 2 (two) times daily. 60 tablet 3   Calcium Carbonate (CALCIUM 600 PO) Take 1,200 mg by mouth daily.     Co-Enzyme Q-10 100 MG CAPS Take 100 mg by mouth daily.  Ferrous Fumarate (HEMOCYTE - 106 MG FE) 324 (106 Fe) MG TABS tablet Take 1 tablet by mouth daily.     gabapentin (NEURONTIN) 800 MG tablet Take 1 tablet by mouth 3 (three) times daily.     glipiZIDE (GLUCOTROL XL) 5 MG 24 hr tablet Take by mouth.     JANUMET XR [phone] MG TB24 Take 1 tablet by mouth daily.     LIVALO 2 MG TABS Take 1 tablet by mouth daily.     losartan (COZAAR) 50 MG tablet Take 50 mg by mouth daily.      magnesium oxide (MAG-OX) 400 MG tablet Take 400 mg by mouth daily.     meloxicam (MOBIC) 15 MG tablet Take 15 mg by mouth daily.      Omega-3 Fatty Acids (FISH OIL) 1000 MG CAPS Take 1 capsule by mouth daily.      pantoprazole (PROTONIX) 40 MG tablet TAKE ONE (1) TABLET BY MOUTH DAILY BEFORE BREAKFAST. 90 tablet 3   sertraline (ZOLOFT) 50 MG tablet Take 50 mg by mouth 2 (two) times daily.  1   sotalol (BETAPACE) 80 MG tablet Take 80 mg by mouth 2 (two) times daily.     TALTZ 80 MG/ML SOAJ Inject 80 mg into the skin every 28 (twenty-eight) days.     vitamin B-12 (CYANOCOBALAMIN) 1000 MCG tablet Take 1,000 mcg by mouth 3 (three) times a week. Takes Sun, Tue, Thurs     No current facility-administered medications for this visit.    Allergies   Allergies as of 09/22/2021 - Review Complete 09/22/2021  Allergen Reaction Noted   Celebrex [celecoxib]  02/24/2011   Sulfa antibiotics  02/24/2011     Review of Systems   General: Negative for anorexia, weight loss, fever, chills, fatigue, weakness. ENT: Negative for hoarseness, difficulty swallowing , nasal congestion. CV: Negative for chest pain, angina, palpitations, dyspnea on exertion, peripheral edema.  Respiratory: Negative for dyspnea at rest, dyspnea on exertion, cough, sputum, wheezing.  GI: See history of present illness. GU:  Negative for dysuria, hematuria, urinary incontinence, urinary frequency, nocturnal urination.  Endo: Negative for unusual weight change.     Physical Exam   BP 129/73 (BP Location: Right Arm, Patient Position: Sitting, Cuff Size: Large)   Pulse 65   Temp (!) 97.1 F (36.2 C) (Temporal)   Ht '5\' 4"'$  (1.626 m)   Wt 214 lb 3.2 oz (97.2 kg)   SpO2 95%   BMI 36.77 kg/m    General: Well-nourished, well-developed in no acute distress.  Eyes: No icterus. Mouth: Oropharyngeal mucosa moist and pink , no lesions erythema or exudate. Lungs: Clear to auscultation bilaterally.  Heart: Regular rate and rhythm, no murmurs rubs or gallops.  Abdomen: Bowel sounds are normal,nondistended, no hepatosplenomegaly or masses,  no abdominal bruits or hernia, no rebound or guarding. She has some fullness on the left anterior rib cage, no  discrete mass, soft in nature, ?lipoma or fat collection. Mild tenderness noted in LUQ/LLQ/epigastric region Rectal: not performed Extremities: No lower extremity edema. No clubbing or deformities. Neuro: Alert and oriented x 4   Skin: Warm and dry, no jaundice.   Psych: Alert and cooperative, normal mood and affect.  Labs   None  Imaging Studies   No results found.  Assessment   Abdominal pain: Multiple locations including left upper quadrant/lower abdominal predominantly left lower quadrant, epigastric.  Associated with loose stool.  Cannot exclude diverticulitis/colitis, underlying malignancy given prior history of metastatic breast cancer.  Upper abdominal  pain with increased belching/gas.  Cannot exclude gastritis/peptic ulcer disease.  Less likely biliary but this too also remains on the differential.  Recommend CT as for step.  Left upper quadrant abdominal mass: Noted over the left anterior lower rib cage, fatty in consistency.  Likely insignificant.  Evaluate at time of upcoming CT.  Hepatic fibrosis: Update labs.  Evaluate further at time of CT.  Diarrhea: Possibly medication related.  Denies any recent antibiotics.  Try adding fiber and/or Imodium.  If persistent symptoms follow-up for further work-up.  PLAN   CT A/P with contrast. Labs. Benefiber or Fiber Choice daily per package instruction, goal for 3 to 4 g/day.  If persistent loose urgent stool, then use Imodium 2 mg every morning, holding for constipation. Stop current probiotic.  If she desires, she can switch to a different probiotic containing different bacteria to see if this is more effective. Continue pantoprazole 40 mg daily.  Laureen Ochs. Bobby Rumpf, [place], [facility]

## 2021-09-24 LAB — COMPREHENSIVE METABOLIC PANEL
ALT: 21 IU/L (ref 0–32)
AST: 28 IU/L (ref 0–40)
Albumin/Globulin Ratio: 1.4 (ref 1.2–2.2)
Albumin: 4.2 g/dL (ref 3.9–4.9)
Alkaline Phosphatase: 78 IU/L (ref 44–121)
BUN/Creatinine Ratio: 16 (ref 12–28)
BUN: 14 mg/dL (ref 8–27)
Bilirubin Total: 0.4 mg/dL (ref 0.0–1.2)
CO2: 25 mmol/L (ref 20–29)
Calcium: 9.4 mg/dL (ref 8.7–10.3)
Chloride: 102 mmol/L (ref 96–106)
Creatinine, Ser: 0.85 mg/dL (ref 0.57–1.00)
Globulin, Total: 2.9 g/dL (ref 1.5–4.5)
Glucose: 142 mg/dL — ABNORMAL HIGH (ref 70–99)
Potassium: 4.5 mmol/L (ref 3.5–5.2)
Sodium: 140 mmol/L (ref 134–144)
Total Protein: 7.1 g/dL (ref 6.0–8.5)
eGFR: 75 mL/min/{1.73_m2} (ref >59)

## 2021-09-24 LAB — IGA: IgA/Immunoglobulin A, Serum: 492 mg/dL — ABNORMAL HIGH (ref 87–352)

## 2021-09-24 LAB — CBC WITH DIFFERENTIAL/PLATELET
Basophils Absolute: 0.1 10*3/uL (ref 0.0–0.2)
Basos: 1 %
EOS (ABSOLUTE): 0.2 10*3/uL (ref 0.0–0.4)
Eos: 2 %
Hematocrit: 42 % (ref 34.0–46.6)
Hemoglobin: 13.6 g/dL (ref 11.1–15.9)
Immature Grans (Abs): 0 10*3/uL (ref 0.0–0.1)
Immature Granulocytes: 1 %
Lymphocytes Absolute: 2 10*3/uL (ref 0.7–3.1)
Lymphs: 24 %
MCH: 28.2 pg (ref 26.6–33.0)
MCHC: 32.4 g/dL (ref 31.5–35.7)
MCV: 87 fL (ref 79–97)
Monocytes Absolute: 0.6 10*3/uL (ref 0.1–0.9)
Monocytes: 8 %
Neutrophils Absolute: 5.5 10*3/uL (ref 1.4–7.0)
Neutrophils: 64 %
Platelets: 176 10*3/uL (ref 150–450)
RBC: 4.82 x10E6/uL (ref 3.77–5.28)
RDW: 13 % (ref 11.7–15.4)
WBC: 8.4 10*3/uL (ref 3.4–10.8)

## 2021-09-24 LAB — LIPASE: Lipase: 46 U/L (ref 14–72)

## 2021-09-24 LAB — IRON,TIBC AND FERRITIN PANEL
Ferritin: 176 ng/mL — ABNORMAL HIGH (ref 15–150)
Iron Saturation: 35 % (ref 15–55)
Iron: 98 ug/dL (ref 27–139)
Total Iron Binding Capacity: 282 ug/dL (ref 250–450)
UIBC: 184 ug/dL (ref 118–369)

## 2021-09-24 LAB — TISSUE TRANSGLUTAMINASE, IGA: Transglutaminase IgA: <2 U/mL (ref 0–3)

## 2021-09-30 ENCOUNTER — Ambulatory Visit (HOSPITAL_COMMUNITY)
Admission: RE | Admit: 2021-09-30 | Discharge: 2021-09-30 | Disposition: A | Discharge status: Home / Self Care | Payer: Medicare Other | Source: Ambulatory Visit | Attending: Gastroenterology | Admitting: Gastroenterology

## 2021-09-30 ENCOUNTER — Encounter (HOSPITAL_COMMUNITY): Payer: Self-pay | Admitting: Radiology

## 2021-09-30 DIAGNOSIS — R197 Diarrhea, unspecified: Secondary | ICD-10-CM | POA: Insufficient documentation

## 2021-09-30 DIAGNOSIS — I700 Atherosclerosis of aorta: Secondary | ICD-10-CM | POA: Insufficient documentation

## 2021-09-30 DIAGNOSIS — K5730 Diverticulosis of large intestine without perforation or abscess without bleeding: Secondary | ICD-10-CM | POA: Insufficient documentation

## 2021-09-30 DIAGNOSIS — K7460 Unspecified cirrhosis of liver: Secondary | ICD-10-CM | POA: Diagnosis not present

## 2021-09-30 DIAGNOSIS — R1030 Lower abdominal pain, unspecified: Secondary | ICD-10-CM | POA: Diagnosis present

## 2021-09-30 MED ORDER — IOHEXOL 300 MG/ML  SOLN
100.0000 mL | Freq: Once | INTRAMUSCULAR | Status: AC | PRN
  Administered 2021-09-30: 100 mL via INTRAVENOUS

## 2021-10-28 ENCOUNTER — Other Ambulatory Visit: Payer: Self-pay | Admitting: Gastroenterology

## 2021-10-28 DIAGNOSIS — K219 Gastro-esophageal reflux disease without esophagitis: Secondary | ICD-10-CM

## 2021-10-29 ENCOUNTER — Telehealth: Payer: Self-pay

## 2021-10-29 ENCOUNTER — Other Ambulatory Visit: Payer: Self-pay | Admitting: Gastroenterology

## 2021-10-29 DIAGNOSIS — K219 Gastro-esophageal reflux disease without esophagitis: Secondary | ICD-10-CM

## 2021-10-29 NOTE — Telephone Encounter (Signed)
Refill request received from [facility] in [place] for Pantoprazole Sod DR for 40 mg tablets. Qty: 62. Pt was last seen on 09/22/21

## 2021-11-30 ENCOUNTER — Encounter: Payer: Self-pay | Admitting: *Deleted

## 2022-01-19 NOTE — Progress Notes (Deleted)
GI Office Note    Referring Provider: Karie Mainland, August Summer,* Primary Care Physician:  Esperanza Sheets, [place]  Primary Gastroenterologist:  Chief Complaint   No chief complaint on file.   History of Present Illness   Janet Lawson is a 66 y.o. female presenting today          Medications   Current Outpatient Medications  Medication Sig Dispense Refill   apixaban (ELIQUIS) 5 MG TABS tablet Take 1 tablet (5 mg total) by mouth 2 (two) times daily. 60 tablet 3   Calcium Carbonate (CALCIUM 600 PO) Take 1,200 mg by mouth daily.     Co-Enzyme Q-10 100 MG CAPS Take 100 mg by mouth daily.     Ferrous Fumarate (HEMOCYTE - 106 MG FE) 324 (106 Fe) MG TABS tablet Take 1 tablet by mouth daily.     gabapentin (NEURONTIN) 800 MG tablet Take 1 tablet by mouth 3 (three) times daily.     glipiZIDE (GLUCOTROL XL) 5 MG 24 hr tablet Take by mouth.     JANUMET XR [phone] MG TB24 Take 1 tablet by mouth daily.     LIVALO 2 MG TABS Take 1 tablet by mouth daily.     losartan (COZAAR) 50 MG tablet Take 50 mg by mouth daily.      magnesium oxide (MAG-OX) 400 MG tablet Take 400 mg by mouth daily.     meloxicam (MOBIC) 15 MG tablet Take 15 mg by mouth daily.      Omega-3 Fatty Acids (FISH OIL) 1000 MG CAPS Take 1 capsule by mouth daily.     pantoprazole (PROTONIX) 40 MG tablet TAKE ONE TABLET BY MOUTH DAILY BEFORE BREAKFAST 90 tablet 1   sertraline (ZOLOFT) 50 MG tablet Take 50 mg by mouth 2 (two) times daily.  1   sotalol (BETAPACE) 80 MG tablet Take 80 mg by mouth 2 (two) times daily.     TALTZ 80 MG/ML SOAJ Inject 80 mg into the skin every 28 (twenty-eight) days.     vitamin B-12 (CYANOCOBALAMIN) 1000 MCG tablet Take 1,000 mcg by mouth 3 (three) times a week. Takes Sun, Tue, Thurs     No current facility-administered medications for this visit.    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   PONV (postoperative nausea and vomiting)    Psoriasis     Past Surgical History   Past Surgical History:  Procedure Laterality Date   BREAST LUMPECTOMY Bilateral 2010 (L) & 2012 (R)   COLONOSCOPY  11/2009   1 descending colon adenomatous polyp; repeat in 5 years   COLONOSCOPY N/A 05/29/2014  GUR:[id number] coli/colonic diverticulosis   ESOPHAGOGASTRODUODENOSCOPY N/A 05/29/2014   EGB:TDVVOH/YW/VPXTGGY erosions s/p bx   ESOPHAGOGASTRODUODENOSCOPY ENDOSCOPY  2001   Had stomach polyps (per patient); unable to find procedure notes.   INCONTINENCE SURGERY     KNEE ARTHROSCOPY     SUPERFICIAL LYMPH NODE BIOPSY / EXCISION Bilateral    Bilateral breast associated    Past Family History   Family History  Problem Relation Age of Onset   Heart disease Mother    High blood pressure Mother    Diabetes Mellitus II Mother    Colon cancer Neg Hx     Past Social History   Social History   Socioeconomic History   Marital status: Married    Spouse name: Not on file   Number of children: Not on file   Years of education: Not on file   Highest education level: Not on file  Occupational History   Not on file  Tobacco Use   Smoking status: Never   Smokeless tobacco: Never  Substance and Sexual Activity   Alcohol use: No    Alcohol/week: 0.0 standard drinks of  alcohol   Drug use: No   Sexual activity: Not on file  Other Topics Concern   Not on file  Social History Narrative   Not on file   Social Determinants of Health   Financial Resource Strain: Not on file  Food Insecurity: Not on file  Transportation Needs: Not on file  Physical Activity: Not on file  Stress: Not on file  Social Connections: Not on file  Intimate Partner Violence: Not on file    Review of Systems   General: Negative for anorexia, weight loss, fever, chills, fatigue, weakness. ENT: Negative for hoarseness, difficulty swallowing , nasal congestion. CV: Negative for chest pain, angina, palpitations, dyspnea on exertion, peripheral edema.  Respiratory: Negative for dyspnea at rest, dyspnea on exertion, cough, sputum, wheezing.  GI: See history of present illness. GU:  Negative for dysuria, hematuria, urinary incontinence, urinary frequency, nocturnal urination.  Endo: Negative for unusual weight change.     Physical Exam   There were no vitals taken for this visit.   General: Well-nourished, well-developed in no acute distress.  Eyes: No icterus. Mouth: Oropharyngeal mucosa moist and pink , no lesions erythema or exudate. Lungs: Clear to auscultation bilaterally.  Heart: Regular rate and rhythm, no murmurs rubs or gallops.  Abdomen: Bowel sounds are normal, nontender, nondistended, no hepatosplenomegaly or masses,  no abdominal bruits or hernia , no rebound or guarding.  Rectal: ***  Extremities: No lower extremity edema. No clubbing or deformities. Neuro: Alert and oriented x 4   Skin: Warm and dry, no jaundice.   Psych: Alert and cooperative, normal mood and affect.  Labs   *** Imaging Studies   No results found.  Assessment       PLAN   ***   Laureen Ochs. Bobby Rumpf, [place], [facility]

## 2022-01-20 ENCOUNTER — Ambulatory Visit: Payer: Medicare Other | Admitting: Gastroenterology

## 2022-05-27 LAB — COLOGUARD
COLOGUARD: NEGATIVE
COLOGUARD: NEGATIVE

## 2022-05-27 LAB — EXTERNAL GENERIC LAB PROCEDURE: COLOGUARD: NEGATIVE

## 2023-07-18 ENCOUNTER — Ambulatory Visit: Admitting: Gastroenterology

## 2023-07-18 ENCOUNTER — Telehealth: Payer: Self-pay | Admitting: *Deleted

## 2023-07-18 ENCOUNTER — Encounter: Payer: Self-pay | Admitting: Gastroenterology

## 2023-07-18 VITALS — BP 130/85 | HR 67 | Temp 98.6°F | Ht 64.0 in | Wt 207.6 lb

## 2023-07-18 DIAGNOSIS — K7400 Hepatic fibrosis, unspecified: Secondary | ICD-10-CM

## 2023-07-18 DIAGNOSIS — K7460 Unspecified cirrhosis of liver: Secondary | ICD-10-CM | POA: Diagnosis not present

## 2023-07-18 DIAGNOSIS — D696 Thrombocytopenia, unspecified: Secondary | ICD-10-CM

## 2023-07-18 DIAGNOSIS — D649 Anemia, unspecified: Secondary | ICD-10-CM | POA: Insufficient documentation

## 2023-07-18 DIAGNOSIS — Z860100 Personal history of colon polyps, unspecified: Secondary | ICD-10-CM | POA: Diagnosis not present

## 2023-07-18 NOTE — Patient Instructions (Signed)
 We will schedule you for an ultrasound of your liver at [facility].  Take your lab orders the day of your ultrasound and complete labs at hospital.  We will schedule you for an upper endoscopy once we get approval to hold your Eliquis  48 hours prior to procedure.

## 2023-07-18 NOTE — Progress Notes (Unsigned)
 GI Office Note    Referring Provider: Dempsey Lawson, Janet Lawson,* Primary Care Physician:  Janet Lawson, [place]  Primary Gastroenterologist: Janet Cedar, MD   Chief Complaint   Chief Complaint  Patient presents with   Follow-up    Pt here for follow up on iron and schedule EGD    History of Present Illness   Janet Lawson is a 68 y.o. female presenting today at the request of ***. Last seen 09/2021. H/o GERD, liver fibrosis. H/o adenomatous colon polyps in 2011. No polyps on colonoscopy in 2016. Last EGD in 2016 with hiatal hernia, gastric erosions, benign fundic gland polyp and gastritis but no hpylori.   Fibrosis of the liver (2016) noted on MRI, ultimately underwent liver biopsy (2018) which found fibrosis with no excessive iron, no alpha-1 antitrypsin deposits, overall nonspecific with the appearance of likely being caused by medications such as methotrexate.  No overt signs of cirrhosis.Mildly nodular liver on ultrasound in September 2020. Janet Lawson FibroSure with F2    Did not have ov in 2 months with rmr as advised, lost to follow up.   Labs 04/23/2023: Hgb 13.8, platelets 200, na 144, k 4.2, bun 9, cre 1.04, alb 2.9, Tbili 0.6, AP 65, AST 24, ALT 18.  2/14 hgb 11.6, hgb 12, plt 116         Labs 07/13/23: Hgb 13, MCV 91, WBC 7.2, plastelts 173, glucose 187, BUN 21, creatinine 1.07, sodium 143, potassium 4.7, albumin 4, total bilirubin 0.3, alk phos 86, AST 21, ALT 17,   Janet Lawson:   PCP had patient on iron.   Can't make it from [place] after bowel prep had to stop on side of road.   BM normal every morning. Soft. No melena, brbpr. Stools dark on iron, quit iron and back to normal. No heartburn. No nausea.   Insulin    Janet Lawson. Afib elqius.  Cologuard negative 03/2023.            Wt Readings from Last 6 Encounters:  07/18/23 207 lb 9.6 oz (94.2 kg)  09/22/21 214 lb 3.2 oz (97.2 kg)  10/08/20 214 lb 9.6 oz (97.3 kg)  07/18/19 209  lb 12.8 oz (95.2 kg)  10/25/18 196 lb 9.6 oz (89.2 kg)  06/01/17 201 lb 6.4 oz (91.4 kg)   CT A/P with contrast 09/2021.  IMPRESSION: -No acute findings. -Hepatic cirrhosis. No evidence of hepatic neoplasm or abdominal metastatic disease. -Colonic Lawson, without radiographic evidence of diverticulitis.   Colonoscopy March 2016: Melanosis coli.  Colonic Lawson.  Next colonoscopy in 5 years.  EGD in March 2016: Normal esophagus.  Hiatal hernia.  Gastric polyps.  Gastric erosions.  Status post biopsy.  Benign fundic gland polyp.  Mild chronic gastritis with no H. pylori.  Medications   Current Outpatient Medications  Medication Sig Dispense Refill   apixaban  (ELIQUIS ) 5 MG TABS tablet Take 1 tablet (5 mg total) by mouth 2 (two) times daily. 60 tablet 3   gabapentin  (NEURONTIN ) 800 MG tablet Take 1 tablet by mouth 3 (three) times daily.     Insulin  Glargine (BASAGLAR KWIKPEN [place]) Inject into the skin in the morning and at bedtime. 40 units in the am and 16 units at pm     Insulin  Lispro (ADMELOG IJ) Inject 100 Units/100 mL as directed 3 x daily with food.     LIVALO 2 MG TABS Take 1 tablet by mouth daily.     losartan  (COZAAR ) 50 MG tablet Take 50  mg by mouth daily.      meloxicam  (MOBIC ) 15 MG tablet Take 15 mg by mouth daily.      metFORMIN  (GLUCOPHAGE -XR) 500 MG 24 hr tablet Take 500 mg by mouth daily with breakfast.     pantoprazole  (PROTONIX ) 40 MG tablet TAKE ONE TABLET BY MOUTH DAILY BEFORE BREAKFAST 90 tablet 1   sertraline (ZOLOFT) 50 MG tablet Take 50 mg by mouth 2 (two) times daily.  1   sotalol (BETAPACE) 80 MG tablet Take 80 mg by mouth 2 (two) times daily.     TALTZ 80 MG/ML SOAJ Inject 80 mg into the skin every 28 (twenty-eight) days.     No current facility-administered medications for this visit.    Allergies   Allergies as of 07/18/2023 - Review Complete 07/18/2023  Allergen Reaction Noted   Celebrex [celecoxib]  02/24/2011   Sulfa antibiotics   02/24/2011     Past Medical History   Past Medical History:  Diagnosis Date   Adenomatous polyp of colon 11/2009   recommended repeat in 5 years   Atrial fibrillation with RVR (HCC)    Breast CA (HCC) 2010 (L) and 2012 (R)   Bilateral   Chemotherapy-induced neuropathy (HCC)    bilateral hands and feet   Complication of anesthesia    Diabetes type 2, controlled (HCC)    [facility]    Duodenal ulcer    Gastric ulcer    GERD (gastroesophageal reflux disease)    Hiatal hernia    HTN (hypertension)    Hypercholesteremia    Melanoma (HCC)    OA (osteoarthritis)    feets, hands, knees, back.   PONV (postoperative nausea and vomiting)    Psoriasis     Past Surgical History   Past Surgical History:  Procedure Laterality Date   BREAST LUMPECTOMY Bilateral 2010 (L) & 2012 (R)   COLONOSCOPY  11/2009   1 descending colon adenomatous polyp; repeat in 5 years   COLONOSCOPY N/A 05/29/2014   Janet Lawson   ESOPHAGOGASTRODUODENOSCOPY N/A 05/29/2014   Janet Lawson   ESOPHAGOGASTRODUODENOSCOPY ENDOSCOPY  2001   Had stomach polyps (per patient); unable to find procedure notes.   INCONTINENCE SURGERY     KNEE ARTHROSCOPY     SUPERFICIAL LYMPH NODE BIOPSY / EXCISION Bilateral    Bilateral breast associated    Past Family History   Family History  Problem Relation Age of Onset   Heart disease Mother    High blood pressure Mother    Diabetes Mellitus II Mother    Colon cancer Neg Hx     Past Social History   Social History   Socioeconomic History   Marital status: Married    Spouse name: Not on file   Number of children: Not on file   Years of education: Not on file   Highest education level: Not on file  Occupational History   Not on file  Tobacco Use   Smoking status: Never   Smokeless tobacco: Never  Substance and Sexual Activity   Alcohol use: No    Alcohol/week: 0.0 standard drinks of alcohol   Drug use:  No   Sexual activity: Not on file  Other Topics Concern   Not on file  Social History Narrative   Not on file   Social Drivers of Health   Financial Resource Strain: Not on file  Food Insecurity: Not on file  Transportation Needs: Not on file  Physical Activity: Not on file  Stress: Not  on file  Social Connections: Not on file  Intimate Partner Violence: Not on file    Review of Systems   General: Negative for anorexia, weight loss, fever, chills, fatigue, weakness. ENT: Negative for hoarseness, difficulty swallowing , nasal congestion. CV: Negative for chest pain, angina, palpitations, dyspnea on exertion, peripheral edema.  Respiratory: Negative for dyspnea at rest, dyspnea on exertion, cough, sputum, wheezing.  GI: See history of present illness. GU:  Negative for dysuria, hematuria, urinary incontinence, urinary frequency, nocturnal urination.  Endo: Negative for unusual weight change.     Physical Exam   BP 130/85   Pulse 67   Temp 98.6 F (37 C)   Ht 5\' 4"  (1.626 m)   Wt 207 lb 9.6 oz (94.2 kg)   BMI 35.63 kg/m    General: Well-nourished, well-developed in no acute distress.  Eyes: No icterus. Mouth: Oropharyngeal mucosa moist and pink , no lesions erythema or exudate. Lungs: Clear to auscultation bilaterally.  Heart: Regular rate and rhythm, no murmurs rubs or gallops.  Abdomen: Bowel sounds are normal, nontender, nondistended, no hepatosplenomegaly or masses,  no abdominal bruits or hernia , no rebound or guarding.  Rectal: ***  Extremities: No lower extremity edema. No clubbing or deformities. Neuro: Alert and oriented x 4   Skin: Warm and dry, no jaundice.   Psych: Alert and cooperative, normal mood and affect.  Labs   *** Imaging Studies   No results found.  Assessment       PLAN   ***labs with u/s     Trudie Fuse. Harles Lied, MHS, PA-C [facility]

## 2023-07-18 NOTE — Telephone Encounter (Signed)
 Faxed clearance to cardiologist Dr.Brian Fagol in [place], [place]

## 2023-07-18 NOTE — Telephone Encounter (Signed)
  Request for patient to stop medication prior to procedure or is needing cleareance  07/18/23  Janet Lawson 1955-08-20  What type of surgery is being performed? Esophagogastroduodenoscopy (EGD)  When is surgery scheduled? 08/24/23   What type of clearance is required (medical or pharmacy to hold medication or both? medication  Are there any medications that need to be held prior to surgery and how long? Eliquis  x 2 days  Name of physician performing surgery?  Dr.Rourk [facility] at [employer]: [phone] Fax: [phone]  Anethesia type (none, local, MAC, general)? MAC

## 2023-07-25 ENCOUNTER — Other Ambulatory Visit (HOSPITAL_COMMUNITY)
Admission: RE | Admit: 2023-07-25 | Discharge: 2023-07-25 | Disposition: A | Discharge status: Home / Self Care | Source: Ambulatory Visit | Attending: Gastroenterology | Admitting: Gastroenterology

## 2023-07-25 ENCOUNTER — Ambulatory Visit (HOSPITAL_COMMUNITY)
Admission: RE | Admit: 2023-07-25 | Discharge: 2023-07-25 | Disposition: A | Discharge status: Home / Self Care | Source: Ambulatory Visit | Attending: Gastroenterology | Admitting: Gastroenterology

## 2023-07-25 DIAGNOSIS — K7460 Unspecified cirrhosis of liver: Secondary | ICD-10-CM | POA: Insufficient documentation

## 2023-07-25 DIAGNOSIS — D649 Anemia, unspecified: Secondary | ICD-10-CM | POA: Insufficient documentation

## 2023-07-25 DIAGNOSIS — K7400 Hepatic fibrosis, unspecified: Secondary | ICD-10-CM | POA: Diagnosis present

## 2023-07-25 LAB — CBC WITH DIFFERENTIAL/PLATELET
Abs Immature Granulocytes: 0.02 10*3/uL (ref 0.00–0.07)
Basophils Absolute: 0.1 10*3/uL (ref 0.0–0.1)
Basophils Relative: 1 %
Eosinophils Absolute: 0.2 10*3/uL (ref 0.0–0.5)
Eosinophils Relative: 3 %
HCT: 39.6 % (ref 36.0–46.0)
Hemoglobin: 12.8 g/dL (ref 12.0–15.0)
Immature Granulocytes: 0 %
Lymphocytes Relative: 22 %
Lymphs Abs: 1.4 10*3/uL (ref 0.7–4.0)
MCH: 28.9 pg (ref 26.0–34.0)
MCHC: 32.3 g/dL (ref 30.0–36.0)
MCV: 89.4 fL (ref 80.0–100.0)
Monocytes Absolute: 0.5 10*3/uL (ref 0.1–1.0)
Monocytes Relative: 8 %
Neutro Abs: 4.3 10*3/uL (ref 1.7–7.7)
Neutrophils Relative %: 66 %
Platelets: 169 10*3/uL (ref 150–400)
RBC: 4.43 MIL/uL (ref 3.87–5.11)
RDW: 13.2 % (ref 11.5–15.5)
WBC: 6.5 10*3/uL (ref 4.0–10.5)
nRBC: 0 % (ref 0.0–0.2)

## 2023-07-25 LAB — IRON AND TIBC
Iron: 84 ug/dL (ref 28–170)
Saturation Ratios: 27 % (ref 10.4–31.8)
TIBC: 310 ug/dL (ref 250–450)
UIBC: 226 ug/dL

## 2023-07-25 LAB — COMPREHENSIVE METABOLIC PANEL WITH GFR
ALT: 19 U/L (ref 0–44)
AST: 26 U/L (ref 15–41)
Albumin: 3.5 g/dL (ref 3.5–5.0)
Alkaline Phosphatase: 61 U/L (ref 38–126)
Anion gap: 10 (ref 5–15)
BUN: 20 mg/dL (ref 8–23)
CO2: 22 mmol/L (ref 22–32)
Calcium: 9.1 mg/dL (ref 8.9–10.3)
Chloride: 108 mmol/L (ref 98–111)
Creatinine, Ser: 0.82 mg/dL (ref 0.44–1.00)
GFR, Estimated: >60 mL/min (ref >60)
Glucose, Bld: 109 mg/dL — ABNORMAL HIGH (ref 70–99)
Potassium: 4.3 mmol/L (ref 3.5–5.1)
Sodium: 140 mmol/L (ref 135–145)
Total Bilirubin: 0.8 mg/dL (ref 0.0–1.2)
Total Protein: 7.3 g/dL (ref 6.5–8.1)

## 2023-07-25 LAB — PROTIME-INR
INR: 1.2 (ref 0.8–1.2)
Prothrombin Time: 15.4 s — ABNORMAL HIGH (ref 11.4–15.2)

## 2023-07-25 LAB — FERRITIN: Ferritin: 117 ng/mL (ref 11–307)

## 2023-07-26 ENCOUNTER — Ambulatory Visit: Payer: Self-pay | Admitting: Gastroenterology

## 2023-07-26 LAB — AFP TUMOR MARKER: AFP, Serum, Tumor Marker: 2.6 ng/mL (ref 0.0–9.2)

## 2023-07-27 NOTE — Telephone Encounter (Signed)
 Attempted to call cardiology office, no answer, numerous rings.  Refaxed clearance.

## 2023-07-28 ENCOUNTER — Encounter: Payer: Self-pay | Admitting: *Deleted

## 2023-07-28 NOTE — Telephone Encounter (Signed)
 Clearance scanned under media tab

## 2023-07-28 NOTE — Telephone Encounter (Signed)
 Pt has been scheduled on 08/24/23. Instructions sent via mychart

## 2023-08-19 NOTE — Patient Instructions (Addendum)
 Janet Lawson  08/19/2023     @PREFPERIOPPHARMACY @   Your procedure is scheduled on  08/24/2023.   Report to [facility] at  0900 A.M.   Call this number if you have problems the morning of surgery:  [phone]  If you experience any cold or flu symptoms such as cough, fever, chills, shortness of breath, etc. between now and your scheduled surgery, please notify us  at the above number.   Remember:        Your last dose of eliquis  should be on 08/21/2023.        Take 1/2 of your usual night time insulin  the night before your procedure.        DO NOT tale any medications for diabetes the morning of your procedure.   Follow the diet instructions given to you by the office.    You may drink clear liquids until 0700 am on 08/24/2023.    Clear liquids allowed are:                    Water , Juice (No red color; non-citric and without pulp; diabetics please choose diet or no sugar options), Carbonated beverages (diabetics please choose diet or no sugar options), Clear Tea (No creamer, milk, or cream, including half & half and powdered creamer), Black Coffee Only (No creamer, milk or cream, including half & half and powdered creamer), and Clear Sports drink (No red color; diabetics please choose diet or no sugar options)    Take these medicines the morning of surgery with A SIP OF WATER          gabapentin , meloxicam , pantoprazole , sertraline, sotalol.    Do not wear jewelry, make-up or nail polish, including gel polish,  artificial nails, or any other type of covering on natural nails (fingers and  toes).  Do not wear lotions, powders, or perfumes, or deodorant.  Do not shave 48 hours prior to surgery.  Men may shave face and neck.  Do not bring valuables to the hospital.  [facility] is not responsible for any belongings or valuables.  Contacts, dentures or bridgework may not be worn into surgery.  Leave your suitcase in the car.  After surgery it may be brought to your  room.  For patients admitted to the hospital, discharge time will be determined by your treatment team.  Patients discharged the day of surgery will not be allowed to drive home and must have someone with them for 24 hours before your procedure.Aaron Aas    Special instructions:   DO NOT smoke tobacco or vape for 24 hours before your procedure.  Please read over the following fact sheets that you were given. Anesthesia Post-op Instructions and Care and Recovery After Surgery       Upper Endoscopy, Adult, Care After After the procedure, it is common to have a sore throat. It is also common to have: Mild stomach pain or discomfort. Bloating. Nausea. Follow these instructions at home: The instructions below may help you care for yourself at home. Your health care provider may give you more instructions. If you have questions, ask your health care provider. If you were given a sedative during the procedure, it can affect you for several hours. Do not drive or operate machinery until your health care provider says that it is safe. If you will be going home right after the procedure, plan to have a responsible adult: Take you home from the hospital or clinic. You  will not be allowed to drive. Care for you for the time you are told. Follow instructions from your health care provider about what you may eat and drink. Return to your normal activities as told by your health care provider. Ask your health care provider what activities are safe for you. Take over-the-counter and prescription medicines only as told by your health care provider. Contact a health care provider if you: Have a sore throat that lasts longer than one day. Have trouble swallowing. Have a fever. Get help right away if you: Vomit blood or your vomit looks like coffee grounds. Have bloody, black, or tarry stools. Have a very bad sore throat or you cannot swallow. Have difficulty breathing or very bad pain in your chest or  abdomen. These symptoms may be an emergency. Get help right away. Call 911. Do not wait to see if the symptoms will go away. Do not drive yourself to the hospital. Summary After the procedure, it is common to have a sore throat, mild stomach discomfort, bloating, and nausea. If you were given a sedative during the procedure, it can affect you for several hours. Do not drive until your health care provider says that it is safe. Follow instructions from your health care provider about what you may eat and drink. Return to your normal activities as told by your health care provider. This information is not intended to replace advice given to you by your health care provider. Make sure you discuss any questions you have with your health care provider. Document Revised: 06/10/2021 Document Reviewed: 06/10/2021 Elsevier Patient Education  2024 [employer]General Anesthesia, Adult, Care After The following information offers guidance on how to care for yourself after your procedure. Your health care provider may also give you more specific instructions. If you have problems or questions, contact your health care provider. What can I expect after the procedure? After the procedure, it is common for people to: Have pain or discomfort at the IV site. Have nausea or vomiting. Have a sore throat or hoarseness. Have trouble concentrating. Feel cold or chills. Feel weak, sleepy, or tired (fatigue). Have soreness and body aches. These can affect parts of the body that were not involved in surgery. Follow these instructions at home: For the time period you were told by your health care provider:  Rest. Do not participate in activities where you could fall or become injured. Do not drive or use machinery. Do not drink alcohol. Do not take sleeping pills or medicines that cause drowsiness. Do not make important decisions or sign legal documents. Do not take care of children on your own. General  instructions Drink enough fluid to keep your urine pale yellow. If you have sleep apnea, surgery and certain medicines can increase your risk for breathing problems. Follow instructions from your health care provider about wearing your sleep device: Anytime you are sleeping, including during daytime naps. While taking prescription pain medicines, sleeping medicines, or medicines that make you drowsy. Return to your normal activities as told by your health care provider. Ask your health care provider what activities are safe for you. Take over-the-counter and prescription medicines only as told by your health care provider. Do not use any products that contain nicotine or tobacco. These products include cigarettes, chewing tobacco, and vaping devices, such as e-cigarettes. These can delay incision healing after surgery. If you need help quitting, ask your health care provider. Contact a health care provider if: You have nausea or vomiting that does  not get better with medicine. You vomit every time you eat or drink. You have pain that does not get better with medicine. You cannot urinate or have bloody urine. You develop a skin rash. You have a fever. Get help right away if: You have trouble breathing. You have chest pain. You vomit blood. These symptoms may be an emergency. Get help right away. Call 911. Do not wait to see if the symptoms will go away. Do not drive yourself to the hospital. Summary After the procedure, it is common to have a sore throat, hoarseness, nausea, vomiting, or to feel weak, sleepy, or fatigue. For the time period you were told by your health care provider, do not drive or use machinery. Get help right away if you have difficulty breathing, have chest pain, or vomit blood. These symptoms may be an emergency. This information is not intended to replace advice given to you by your health care provider. Make sure you discuss any questions you have with your health  care provider. Document Revised: 05/29/2021 Document Reviewed: 05/29/2021 Elsevier Patient Education  2024 [employer].

## 2023-08-22 ENCOUNTER — Encounter (HOSPITAL_COMMUNITY)
Admission: RE | Admit: 2023-08-22 | Discharge: 2023-08-22 | Disposition: A | Discharge status: Home / Self Care | Source: Ambulatory Visit | Attending: Internal Medicine | Admitting: Internal Medicine

## 2023-08-22 ENCOUNTER — Encounter (HOSPITAL_COMMUNITY): Payer: Self-pay

## 2023-08-22 ENCOUNTER — Other Ambulatory Visit: Payer: Self-pay

## 2023-08-22 VITALS — BP 118/66 | HR 62 | Resp 18 | Ht 64.0 in | Wt 207.7 lb

## 2023-08-22 DIAGNOSIS — Z01810 Encounter for preprocedural cardiovascular examination: Secondary | ICD-10-CM | POA: Insufficient documentation

## 2023-08-22 DIAGNOSIS — I10 Essential (primary) hypertension: Secondary | ICD-10-CM | POA: Insufficient documentation

## 2023-08-24 ENCOUNTER — Encounter (HOSPITAL_COMMUNITY): Payer: Self-pay | Admitting: Internal Medicine

## 2023-08-24 ENCOUNTER — Ambulatory Visit (HOSPITAL_COMMUNITY): Admitting: Certified Registered"

## 2023-08-24 ENCOUNTER — Other Ambulatory Visit: Payer: Self-pay

## 2023-08-24 ENCOUNTER — Encounter (HOSPITAL_COMMUNITY)
Admission: RE | Disposition: A | Discharge status: Home / Self Care | Payer: Self-pay | Source: Home / Self Care | Attending: Internal Medicine

## 2023-08-24 ENCOUNTER — Ambulatory Visit (HOSPITAL_COMMUNITY)
Admission: RE | Admit: 2023-08-24 | Discharge: 2023-08-24 | Disposition: A | Discharge status: Home / Self Care | Attending: Internal Medicine | Admitting: Internal Medicine

## 2023-08-24 DIAGNOSIS — K2950 Unspecified chronic gastritis without bleeding: Secondary | ICD-10-CM | POA: Diagnosis not present

## 2023-08-24 DIAGNOSIS — K7460 Unspecified cirrhosis of liver: Secondary | ICD-10-CM | POA: Diagnosis present

## 2023-08-24 DIAGNOSIS — I4891 Unspecified atrial fibrillation: Secondary | ICD-10-CM | POA: Insufficient documentation

## 2023-08-24 DIAGNOSIS — I10 Essential (primary) hypertension: Secondary | ICD-10-CM

## 2023-08-24 DIAGNOSIS — E119 Type 2 diabetes mellitus without complications: Secondary | ICD-10-CM | POA: Diagnosis not present

## 2023-08-24 DIAGNOSIS — K317 Polyp of stomach and duodenum: Secondary | ICD-10-CM | POA: Insufficient documentation

## 2023-08-24 DIAGNOSIS — K449 Diaphragmatic hernia without obstruction or gangrene: Secondary | ICD-10-CM | POA: Insufficient documentation

## 2023-08-24 DIAGNOSIS — Z7901 Long term (current) use of anticoagulants: Secondary | ICD-10-CM | POA: Diagnosis not present

## 2023-08-24 HISTORY — PX: ESOPHAGOGASTRODUODENOSCOPY: SHX5428

## 2023-08-24 LAB — GLUCOSE, CAPILLARY: Glucose-Capillary: 155 mg/dL — ABNORMAL HIGH (ref 70–99)

## 2023-08-24 SURGERY — EGD (ESOPHAGOGASTRODUODENOSCOPY)
Anesthesia: General

## 2023-08-24 MED ORDER — LACTATED RINGERS IV SOLN: INTRAVENOUS | Status: DC | PRN

## 2023-08-24 MED ORDER — LIDOCAINE 2% (20 MG/ML) 5 ML SYRINGE
INTRAMUSCULAR | Status: DC | PRN
  Administered 2023-08-24: 100 mg via INTRAVENOUS

## 2023-08-24 MED ORDER — PROPOFOL 10 MG/ML IV BOLUS
INTRAVENOUS | Status: DC | PRN
  Administered 2023-08-24 (×2): 50 mg via INTRAVENOUS
  Administered 2023-08-24: 100 mg via INTRAVENOUS

## 2023-08-24 MED ORDER — LACTATED RINGERS IV SOLN: INTRAVENOUS | Status: DC

## 2023-08-24 MED ORDER — PROPOFOL 500 MG/50ML IV EMUL
INTRAVENOUS | Status: DC | PRN
Start: 2023-08-24 — End: 2023-08-24
  Administered 2023-08-24: 150 ug/kg/min via INTRAVENOUS

## 2023-08-24 NOTE — Discharge Instructions (Addendum)
 EGD Discharge instructions Please read the instructions outlined below and refer to this sheet in the next few weeks. These discharge instructions provide you with general information on caring for yourself after you leave the hospital. Your doctor may also give you specific instructions. While your treatment has been planned according to the most current medical practices available, unavoidable complications occasionally occur. If you have any problems or questions after discharge, please call your doctor. ACTIVITY You may resume your regular activity but move at a slower pace for the next 24 hours.  Take frequent rest periods for the next 24 hours.  Walking will help expel (get rid of) the air and reduce the bloated feeling in your abdomen.  No driving for 24 hours (because of the anesthesia (medicine) used during the test).  You may shower.  Do not sign any important legal documents or operate any machinery for 24 hours (because of the anesthesia used during the test).  NUTRITION Drink plenty of fluids.  You may resume your normal diet.  Begin with a light meal and progress to your normal diet.  Avoid alcoholic beverages for 24 hours or as instructed by your caregiver.  MEDICATIONS You may resume your normal medications unless your caregiver tells you otherwise.  WHAT YOU CAN EXPECT TODAY You may experience abdominal discomfort such as a feeling of fullness or "gas" pains.  FOLLOW-UP Your doctor will discuss the results of your test with you.  SEEK IMMEDIATE MEDICAL ATTENTION IF ANY OF THE FOLLOWING OCCUR: Excessive nausea (feeling sick to your stomach) and/or vomiting.  Severe abdominal pain and distention (swelling).  Trouble swallowing.  Temperature over 101 F (37.8 C).  Rectal bleeding or vomiting of blood.     1 polyp removed from your stomach.  No esophageal varices found  Stomach a little irritated biopsies taken  Further recommendations to follow pending review of  pathology report  Office visit with Azalee Lenz in 3 months  At patient request, called Michael at [facility] findings and recommendations   resume Eliquis  today

## 2023-08-24 NOTE — Transfer of Care (Signed)
 Immediate Anesthesia Transfer of Care Note  Patient: Janet Lawson  Procedure(s) Performed: EGD (ESOPHAGOGASTRODUODENOSCOPY)  Patient Location: Short Stay  Anesthesia Type:General  Level of Consciousness: drowsy  Airway & Oxygen Therapy: Patient Spontanous Breathing and Patient connected to face mask oxygen  Post-op Assessment: Report given to RN and Post -op Vital signs reviewed and stable  Post vital signs: Reviewed and stable  Last Vitals:  Vitals Value Taken Time  BP    Temp    Pulse    Resp    SpO2      Last Pain:  Vitals:   08/24/23 0835  TempSrc:   PainSc: 0-No pain         Complications: No notable events documented.

## 2023-08-24 NOTE — Anesthesia Postprocedure Evaluation (Signed)
 Anesthesia Post Note  Patient: Janet Lawson  Procedure(s) Performed: EGD (ESOPHAGOGASTRODUODENOSCOPY)  Patient location during evaluation: PACU Anesthesia Type: General Level of consciousness: awake and alert Pain management: pain level controlled Vital Signs Assessment: post-procedure vital signs reviewed and stable Respiratory status: spontaneous breathing, nonlabored ventilation and respiratory function stable Cardiovascular status: blood pressure returned to baseline and stable Postop Assessment: no apparent nausea or vomiting Anesthetic complications: no   There were no known notable events for this encounter.   Last Vitals:  Vitals:   08/24/23 0852 08/24/23 0856  BP: (!) 102/59 (!) 109/56  Pulse: 71 71  Resp: 17   Temp: (!) 36.4 C   SpO2: 95% 95%    Last Pain:  Vitals:   08/24/23 0852  TempSrc: Oral  PainSc: 0-No pain                 Kris Burd L Brittlyn Cloe

## 2023-08-24 NOTE — H&P (Signed)
 @LOGO @   Primary Care Physician:  Stevan Eke, [place] Primary Gastroenterologist:  Dr. Riley Cheadle  Pre-Procedure History & Physical: HPI:  Janet Lawson is a 68 y.o. female here for further evaluation of cirrhosis history of gastritis history of colonic polyps.  No dysphagia.  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   Psoriasis     Past Surgical History:  Procedure Laterality Date   BREAST LUMPECTOMY Bilateral 2010 (L) & 2012 (R)   COLONOSCOPY  11/2009   1 descending colon adenomatous polyp; repeat in 5 years   COLONOSCOPY N/A 05/29/2014   NWG:NFAOZHYQM coli/colonic diverticulosis   ESOPHAGOGASTRODUODENOSCOPY N/A 05/29/2014   VHQ:IONGEX/BM/WUXLKGM erosions s/p bx   ESOPHAGOGASTRODUODENOSCOPY ENDOSCOPY  2001   Had stomach polyps (per patient); unable to find procedure notes.   INCONTINENCE SURGERY     KNEE ARTHROSCOPY     SUPERFICIAL LYMPH NODE BIOPSY / EXCISION Bilateral    Bilateral breast associated    Prior to Admission medications   Medication Sig Start Date End Date Taking? Authorizing Provider  gabapentin  (NEURONTIN ) 800 MG tablet Take 1 tablet by mouth 3 (three) times daily. 09/27/18  Yes [provider]  Insulin  Lispro (ADMELOG IJ) Inject 100 Units/100 mL as directed 3 x daily with food.   Yes [provider]  LIVALO 2 MG TABS Take 1 tablet by mouth daily. 08/03/18  Yes [provider]  losartan  (COZAAR ) 50 MG tablet Take 50  mg by mouth daily.    Yes [provider]  meloxicam  (MOBIC ) 15 MG tablet Take 15 mg by mouth daily.    Yes [provider]  metFORMIN  (GLUCOPHAGE -XR) 500 MG 24 hr tablet Take 500 mg by mouth daily with breakfast.   Yes [provider]  pantoprazole  (PROTONIX ) 40 MG tablet TAKE ONE TABLET BY MOUTH DAILY BEFORE BREAKFAST 10/28/21  Yes Shana Daring S, PA-C  sertraline (ZOLOFT) 50 MG tablet Take 50 mg by mouth 2 (two) times daily. 02/15/14  Yes [provider]  sotalol (BETAPACE) 80 MG tablet Take 80 mg by mouth 2 (two) times daily.   Yes [provider]  apixaban  (ELIQUIS ) 5 MG TABS tablet Take 1 tablet (5 mg total) by mouth 2 (two) times daily. 12/07/16   Ursuy, Renee Lynn, PA-C  Insulin  Glargine [facility] KWIKPEN [place]) Inject into the skin in the morning and at bedtime. 40 units in the am and 16 units at pm    [provider]  TALTZ 80 MG/ML SOAJ Inject 80 mg into the skin every 28 (twenty-eight) days. 09/18/20   [provider]    Allergies as of 07/28/2023 - Review Complete 07/18/2023  Allergen Reaction Noted   Celebrex [celecoxib]  02/24/2011   Sulfa antibiotics  02/24/2011    Family History  Problem Relation Age of Onset   Heart disease Mother    High  blood pressure Mother    Diabetes Mellitus II Mother    Colon cancer Neg Hx     Social History   Socioeconomic History   Marital status: Married    Spouse name: Not on file   Number of children: Not on file   Years of education: Not on file   Highest education level: Not on file  Occupational History   Not on file  Tobacco Use   Smoking status: Never   Smokeless tobacco: Never  Substance and Sexual Activity   Alcohol use: No    Alcohol/week: 0.0 standard drinks of alcohol   Drug use: No   Sexual activity: Not on file  Other Topics Concern   Not on file  Social History Narrative   Not on file   Social Drivers of Health   Financial Resource Strain: Not on file   Food Insecurity: Not on file  Transportation Needs: Not on file  Physical Activity: Not on file  Stress: Not on file  Social Connections: Not on file  Intimate Partner Violence: Not on file    Review of Systems: See HPI, otherwise negative ROS  Physical Exam: BP (!) 176/78   Pulse 74   Temp 98.2 F (36.8 C) (Oral)   Resp 18   Ht 5' 4 (1.626 m)   Wt 94.2 kg   SpO2 100%   BMI 35.65 kg/m  General:   Alert,  Well-developed, well-nourished, pleasant and cooperative in NAD Neck:  Supple; no masses or thyromegaly. No significant cervical adenopathy. Lungs:  Clear throughout to auscultation.   No wheezes, crackles, or rhonchi. No acute distress. Heart:  Regular rate and rhythm; no murmurs, clicks, rubs,  or gallops. Abdomen: Non-distended, normal bowel sounds.  Soft and nontender without appreciable mass or hepatosplenomegaly.    Impression/Plan: 68 year old lady with well compensated cirrhosis here for follow-up EGD to assess for portal hypertension and follow-up on gastric polyps.  Biopsies previously negative for H. pylori.  Patient denies any dysphagia, whatsoever. The risks, benefits, limitations, alternatives and imponderables have been reviewed with the patient. Potential for esophageal dilation, biopsy, etc. have also been reviewed.  Questions have been answered. All parties agreeable.      Notice: This dictation was prepared with Dragon dictation along with smaller phrase technology. Any transcriptional errors that result from this process are unintentional and may not be corrected upon review.

## 2023-08-24 NOTE — Anesthesia Preprocedure Evaluation (Addendum)
 Anesthesia Evaluation  Patient identified by MRN, date of birth, ID band Patient awake    Reviewed: Allergy & Precautions, H&P , NPO status , Patient's Chart, lab work & pertinent test results, reviewed documented beta blocker date and time   Airway Mallampati: II  TM Distance: >3 FB Neck ROM: full    Dental no notable dental hx. (+) Dental Advisory Given, Teeth Intact   Pulmonary neg pulmonary ROS   Pulmonary exam normal breath sounds clear to auscultation       Cardiovascular Exercise Tolerance: Good hypertension, Normal cardiovascular exam+ dysrhythmias Atrial Fibrillation  Rhythm:regular Rate:Normal     Neuro/Psych neuropathy  Neuromuscular disease  negative psych ROS   GI/Hepatic Neg liver ROS, hiatal hernia, PUD,GERD  ,,  Endo/Other  diabetes, Type 2    Renal/GU negative Renal ROS  negative genitourinary   Musculoskeletal  (+) Arthritis , Osteoarthritis,    Abdominal   Peds  Hematology negative hematology ROS (+)   Anesthesia Other Findings cancer  Reproductive/Obstetrics negative OB ROS                             Anesthesia Physical Anesthesia Plan  ASA: 3  Anesthesia Plan: General   Post-op Pain Management: Minimal or no pain anticipated   Induction: Intravenous  PONV Risk Score and Plan: Propofol infusion  Airway Management Planned: Natural Airway and Nasal Cannula  Additional Equipment: None  Intra-op Plan:   Post-operative Plan:   Informed Consent: I have reviewed the patients History and Physical, chart, labs and discussed the procedure including the risks, benefits and alternatives for the proposed anesthesia with the patient or authorized representative who has indicated his/her understanding and acceptance.     Dental Advisory Given  Plan Discussed with: CRNA  Anesthesia Plan Comments:        Anesthesia Quick Evaluation

## 2023-08-24 NOTE — Anesthesia Procedure Notes (Signed)
 Date/Time: 08/24/2023 8:34 AM  Performed by: Sherwin Donate, CRNAPre-anesthesia Checklist: Patient identified, Emergency Drugs available, Suction available and Patient being monitored Patient Re-evaluated:Patient Re-evaluated prior to induction Oxygen Delivery Method: Nasal cannula Induction Type: IV induction Placement Confirmation: positive ETCO2 Comments: Optiflow High Flow [place] O2 used.

## 2023-08-24 NOTE — Op Note (Signed)
 [facility] Patient Name: Janet Lawson Procedure Date: 08/24/2023 7:48 AM MRN: [id number] Date of Birth: 08-25-1955 Attending MD: Gemma Kelp , MD, [id number] CSN: [id number] Age: 68 Admit Type: Outpatient Procedure:                Upper GI endoscopy Indications:              Surveillance procedure, Cirrhosis with suspected                            esophageal varices Providers:                Gemma Kelp, MD, Graydon Lazier RN, RN,                            Jolee Naval, Technician Referring MD:              Medicines:                Propofol per Anesthesia Complications:            No immediate complications. Estimated Blood Loss:     Estimated blood loss was minimal. Procedure:                Pre-Anesthesia Assessment:                           - Prior to the procedure, a History and Physical                            was performed, and patient medications and                            allergies were reviewed. The patient's tolerance of                            previous anesthesia was also reviewed. The risks                            and benefits of the procedure and the sedation                            options and risks were discussed with the patient.                            All questions were answered, and informed consent                            was obtained. Prior Anticoagulants: The patient                            last took Eliquis  (apixaban ) 2 days prior to the                            procedure. ASA Grade Assessment: II - A patient  with mild systemic disease. After reviewing the                            risks and benefits, the patient was deemed in                            satisfactory condition to undergo the procedure.                           After obtaining informed consent, the endoscope was                            passed under direct vision. Throughout the                             procedure, the patient's blood pressure, pulse, and                            oxygen saturations were monitored continuously. The                            GIF-H190 ([id number]) scope was introduced through the                            mouth, and advanced to the second part of duodenum.                            The upper GI endoscopy was accomplished without                            difficulty. The patient tolerated the procedure                            well. Scope In: 8:40:16 AM Scope Out: 8:46:43 AM Total Procedure Duration: 0 hours 6 minutes 27 seconds  Findings:      The examined esophagus was normal.      A medium-sized hiatal hernia was present. Scattered fundic gland       appearing gastric polyps. Largest approximately 8 mm. Patchy erythema of       the antrum. No portal gastropathy or gastric varices seen.      The duodenal bulb and second portion of the duodenum were normal. The       large polyp was removed with cold biopsy forceps. Gastric mucosa       biopsied for histology Impression:               - Normal esophagus.                           - Medium-sized hiatal hernia. Gastric polyps?"status                            post polyp removal patchy gastric erythema status  post biopsy                           - Normal duodenal bulb and second portion of the                            duodenum.                           - Gastric inflammation likely NSAID related Moderate Sedation:      Moderate (conscious) sedation was personally administered by an       anesthesia professional. The following parameters were monitored: oxygen       saturation, heart rate, blood pressure, respiratory rate, EKG, adequacy       of pulmonary ventilation, and response to care. Recommendation:           - Patient has a contact number available for                            emergencies. The signs and symptoms of potential                            delayed  complications were discussed with the                            patient. Return to normal activities tomorrow.                            Written discharge instructions were provided to the                            patient.                           - Advance diet as tolerated. Follow-up on                            pathology. Office visit with us  in 3 months.                           - Continue present medications. Procedure Code(s):        --- Professional ---                           [phone], Esophagogastroduodenoscopy, flexible,                            transoral; diagnostic, including collection of                            specimen(s) by brushing or washing, when performed                            (separate procedure) Diagnosis Code(s):        --- Professional ---  K44.9, Diaphragmatic hernia without obstruction or                            gangrene                           K74.60, Unspecified cirrhosis of liver CPT copyright 2022 American Medical Association. All rights reserved. The codes documented in this report are preliminary and upon coder review may  be revised to meet current compliance requirements. Windsor Hatcher. Rhodesia Stanger, MD Gemma Kelp, MD 08/24/2023 9:01:05 AM This report has been signed electronically. Number of Addenda: 0

## 2023-08-25 ENCOUNTER — Ambulatory Visit: Payer: Self-pay | Admitting: Internal Medicine

## 2023-08-25 ENCOUNTER — Encounter (HOSPITAL_COMMUNITY): Payer: Self-pay | Admitting: Internal Medicine

## 2023-08-25 LAB — SURGICAL PATHOLOGY

## 2023-11-28 ENCOUNTER — Ambulatory Visit: Admitting: Gastroenterology

## 2023-12-06 ENCOUNTER — Ambulatory Visit: Admitting: Gastroenterology

## 2024-01-23 ENCOUNTER — Telehealth: Payer: Self-pay | Admitting: Gastroenterology

## 2024-01-23 ENCOUNTER — Ambulatory Visit: Admitting: Gastroenterology

## 2024-01-23 NOTE — Telephone Encounter (Signed)
 Patient no showed her appt today. She should reschedule for follow up liver, gerd. Nonurgent follow up.

## 2024-01-23 NOTE — Progress Notes (Deleted)
 GI Office Note    Referring Provider: Bernabe, August Lawson,* Primary Care Physician:  Janet Lawson, [place]  Primary Gastroenterologist: Janet Hollingshead, MD   Chief Complaint   No chief complaint on file.   History of Present Illness   Janet Lawson is a 67 y.o. female presenting today for follow up. Last seen in office in 07/2023. H/O GERD, liver fibrosis, adenomatous colon polyps.    Fibrosis of the liver (2016) noted on MRI, ultimately underwent liver biopsy (2018) which found fibrosis with no excessive iron, no alpha-1 antitrypsin deposits, overall nonspecific with the appearance of likely being caused by medications such as methotrexate.  No overt signs of cirrhosis at that time. Mildly nodular liver on ultrasound in September 2020. Hollie FibroSure with F2    After last ov, she completed CT A/P with contrast in 09/2021. Liver appeared cirrhotic on that study. Spleen normal. Preserved liver function by labs. Recommended follow up ov in two months with Dr. Hollingshead due to cirrhosis, abdominal pain. She was lost to follow up.      Prior Data   Cologuard negative in 03/2023  Abd u/s 07/2023:  IMPRESSION: 1. No gross focal liver lesion identified. Marked heterogeneous increased echotexture of the liver, nonspecific. The marked heterogeneous echotexture limits evaluation for focal liver lesion. Infiltrating process of the liver is not excluded. 2. Cholelithiasis without sonographic evidence of acute cholecystitis. CT A/P with contrast 09/2021.  IMPRESSION: -No acute findings. -Hepatic cirrhosis. No evidence of hepatic neoplasm or abdominal metastatic disease. -Colonic diverticulosis, without radiographic evidence of diverticulitis.   EGD 08/2023: -normal esophagus -medium-sized hiatal hernia -gastric polyps s/p removal, fundic gland, neg h.pylori -patchy  gastric erythema s/p bx, focal mild chronic inactive gastritis and reactive epithelial changes, neg  h.pylori -gastric inflammation likely NSAID related  Colonoscopy March 2016: Melanosis coli.  Colonic diverticulosis.  Next colonoscopy in 5 years.   EGD in March 2016: Normal esophagus.  Hiatal hernia.  Gastric polyps.  Gastric erosions.  Status post biopsy.  Benign fundic gland polyp.  Mild chronic gastritis with no H. pylori.   Medications   Current Outpatient Medications  Medication Sig Dispense Refill   apixaban  (ELIQUIS ) 5 MG TABS tablet Take 1 tablet (5 mg total) by mouth 2 (two) times daily. 60 tablet 3   gabapentin  (NEURONTIN ) 800 MG tablet Take 1 tablet by mouth 3 (three) times daily.     Insulin  Glargine (BASAGLAR KWIKPEN [place]) Inject into the skin in the morning and at bedtime. 40 units in the am and 16 units at pm     Insulin  Lispro (ADMELOG IJ) Inject 100 Units/100 mL as directed 3 x daily with food.     LIVALO 2 MG TABS Take 1 tablet by mouth daily.     losartan  (COZAAR ) 50 MG tablet Take 50 mg by mouth daily.      meloxicam  (MOBIC ) 15 MG tablet Take 15 mg by mouth daily.      metFORMIN  (GLUCOPHAGE -XR) 500 MG 24 hr tablet Take 500 mg by mouth daily with breakfast.     pantoprazole  (PROTONIX ) 40 MG tablet TAKE ONE TABLET BY MOUTH DAILY BEFORE BREAKFAST 90 tablet 1   sertraline (ZOLOFT) 50 MG tablet Take 50 mg by mouth 2 (two) times daily.  1   sotalol (BETAPACE) 80 MG tablet Take 80 mg by mouth 2 (two) times daily.     TALTZ 80 MG/ML SOAJ Inject 80 mg into the skin every 28 (twenty-eight) days.  No current facility-administered medications for this visit.    Allergies   Allergies as of 01/23/2024 - Review Complete 08/24/2023  Allergen Reaction Noted   Celebrex [celecoxib]  02/24/2011   Sulfa antibiotics  02/24/2011     Past Medical History   Past Medical History:  Diagnosis Date   Adenomatous polyp of colon 11/2009   recommended repeat in 5 years   Atrial fibrillation with RVR (HCC)    Breast CA (HCC) 2010 (L) and 2012 (R)   Bilateral   Chemotherapy-induced  neuropathy    bilateral hands and feet   Diabetes type 2, controlled (HCC)    Diverticulosis    Duodenal ulcer    Gastric ulcer    GERD (gastroesophageal reflux disease)    Hiatal hernia    HTN (hypertension)    Hypercholesteremia    Melanoma (HCC)    OA (osteoarthritis)    feets, hands, knees, back.   Psoriasis     Past Surgical History   Past Surgical History:  Procedure Laterality Date   BREAST LUMPECTOMY Bilateral 2010 (L) & 2012 (R)   COLONOSCOPY  11/2009   1 descending colon adenomatous polyp; repeat in 5 years   COLONOSCOPY N/A 05/29/2014   MFM:fzojwndpd coli/colonic diverticulosis   ESOPHAGOGASTRODUODENOSCOPY N/A 05/29/2014   MFM:wnmfjo/YY/hjdumpr erosions s/p bx   ESOPHAGOGASTRODUODENOSCOPY N/A 08/24/2023   Procedure: EGD (ESOPHAGOGASTRODUODENOSCOPY);  Surgeon: Shaaron Lamar HERO, MD;  Location: [facility];  Service: Endoscopy;  Laterality: N/A;  11:00 am, asa 3   ESOPHAGOGASTRODUODENOSCOPY ENDOSCOPY  2001   Had stomach polyps (per patient); unable to find procedure notes.   INCONTINENCE SURGERY     KNEE ARTHROSCOPY     SUPERFICIAL LYMPH NODE BIOPSY / EXCISION Bilateral    Bilateral breast associated    Past Family History   Family History  Problem Relation Age of Onset   Heart disease Mother    High blood pressure Mother    Diabetes Mellitus II Mother    Colon cancer Neg Hx     Past Social History   Social History   Socioeconomic History   Marital status: Married    Spouse name: Not on file   Number of children: Not on file   Years of education: Not on file   Highest education level: Not on file  Occupational History   Not on file  Tobacco Use   Smoking status: Never   Smokeless tobacco: Never  Substance and Sexual Activity   Alcohol use: No    Alcohol/week: 0.0 standard drinks of alcohol   Drug use: No   Sexual activity: Not on file  Other Topics Concern   Not on file  Social History Narrative   Not on file   Social Drivers of Health    Financial Resource Strain: Not on file  Food Insecurity: Not on file  Transportation Needs: Not on file  Physical Activity: Not on file  Stress: Not on file  Social Connections: Not on file  Intimate Partner Violence: Not on file    Review of Systems   General: Negative for anorexia, weight loss, fever, chills, fatigue, weakness. ENT: Negative for hoarseness, difficulty swallowing , nasal congestion. CV: Negative for chest pain, angina, palpitations, dyspnea on exertion, peripheral edema.  Respiratory: Negative for dyspnea at rest, dyspnea on exertion, cough, sputum, wheezing.  GI: See history of present illness. GU:  Negative for dysuria, hematuria, urinary incontinence, urinary frequency, nocturnal urination.  Endo: Negative for unusual weight change.     Physical Exam  There were no vitals taken for this visit.   General: Well-nourished, well-developed in no acute distress.  Eyes: No icterus. Mouth: Oropharyngeal mucosa moist and pink   Lungs: Clear to auscultation bilaterally.  Heart: Regular rate and rhythm, no murmurs rubs or gallops.  Abdomen: Bowel sounds are normal, nontender, nondistended, no hepatosplenomegaly or masses,  no abdominal bruits or hernia , no rebound or guarding.  Rectal: not performed Extremities: No lower extremity edema. No clubbing or deformities. Neuro: Alert and oriented x 4   Skin: Warm and dry, no jaundice.   Psych: Alert and cooperative, normal mood and affect.  Labs   Lab Results  Component Value Date   NA 140 07/25/2023   CL 108 07/25/2023   K 4.3 07/25/2023   CO2 22 07/25/2023   BUN 20 07/25/2023   CREATININE 0.82 07/25/2023   GFRNONAA >60 07/25/2023   CALCIUM  9.1 07/25/2023   ALBUMIN 3.5 07/25/2023   GLUCOSE 109 (H) 07/25/2023   Lab Results  Component Value Date   ALT 19 07/25/2023   AST 26 07/25/2023   ALKPHOS 61 07/25/2023   BILITOT 0.8 07/25/2023   Lab Results  Component Value Date   WBC 6.5 07/25/2023   HGB  12.8 07/25/2023   HCT 39.6 07/25/2023   MCV 89.4 07/25/2023   PLT 169 07/25/2023   Lab Results  Component Value Date   INR 1.2 07/25/2023   INR 1.1 07/31/2019   INR 1.1 08/10/2016   Lab Results  Component Value Date   IRON 84 07/25/2023   TIBC 310 07/25/2023   FERRITIN 117 07/25/2023    AFP 07/2023 2.6  Imaging Studies   No results found.  Assessment/Plan:      Anemia:no overt gi bleeding, overdue for surveillance colonoscopy for history of colon polyps, but reports negative cologuard in 03/2023. Discussed at length, with anemia, specifically iron deficiency (we do not have iron studies), colonoscopy would be advised. If colonoscopy negative, then typically do offer EGD to evaluate. Given her decline in Hgb recently, use on chronic Eliquis , hepatic cirrhosis with recent thrombocytopenia, would recommend EGD. She is only willing to pursue EGD at this time.    -EGD with Dr. Shaaron. ASA 3.  I have discussed the risks, alternatives, benefits with regards to but not limited to the risk of reaction to medication, bleeding, infection, perforation and the patient is agreeable to proceed. Written consent to be obtained. -we have requested cardiac approval to hold Eliquis  48 hours before procedure.  -continue PPI daily   Hepatic cirrhosis: -liver biopsy in 2018 with portal fibrosis with mild steatosis thought to be medication related, possibly methotrexate. -CT A/P 09/2021, findings of hepatic cirrhosis without portal HTN -recommend updating labs and ruq u/s at this time -recommend EGD mostly for anemia, but recently with thrombocytopenia so would be reasonable to screen for esophageal varices   H/o colon polyps: in 2011. Clear on colonoscopy in 2016. Cologuard negative 03/2023 per patient. She declines colonoscopy at this time due to previous issues as outlined above.      ***MRI surveillance    Janet Lawson. [place], MHS, PA-C [facility]

## 2024-01-24 ENCOUNTER — Encounter: Payer: Self-pay | Admitting: Gastroenterology

## 2024-01-27 ENCOUNTER — Telehealth: Payer: Self-pay | Admitting: Gastroenterology

## 2024-01-27 NOTE — Telephone Encounter (Signed)
 Called patient to reschedule her appointment she missed on 01/23/24. Left voicemail.

## 2024-03-15 ENCOUNTER — Encounter: Payer: Self-pay | Admitting: Gastroenterology

## 2024-03-26 ENCOUNTER — Telehealth: Payer: Self-pay | Admitting: *Deleted

## 2024-03-26 ENCOUNTER — Encounter: Payer: Self-pay | Admitting: *Deleted

## 2024-03-26 ENCOUNTER — Ambulatory Visit: Admitting: Gastroenterology

## 2024-03-26 ENCOUNTER — Encounter: Payer: Self-pay | Admitting: Gastroenterology

## 2024-03-26 VITALS — BP 133/76 | HR 84 | Temp 97.9°F | Ht 64.0 in | Wt 208.8 lb

## 2024-03-26 DIAGNOSIS — K219 Gastro-esophageal reflux disease without esophagitis: Secondary | ICD-10-CM | POA: Diagnosis not present

## 2024-03-26 DIAGNOSIS — K7460 Unspecified cirrhosis of liver: Secondary | ICD-10-CM

## 2024-03-26 MED ORDER — RABEPRAZOLE SODIUM 20 MG PO TBEC: 20.0000 mg | DELAYED_RELEASE_TABLET | Freq: Every day | ORAL | 3 refills | Status: AC

## 2024-03-26 NOTE — Patient Instructions (Signed)
 We will switch you over from pantoprazole  to rabeprazole  20mg  daily before breakfast. Let me know if not on your formulary.   Please complete labs and MRI. We will be in touch with results as available, typically taking about 5 business days for labs and can take 1-2 weeks for imaging.   Return to the office in six months or sooner. Call if reflux symptoms do not improve.

## 2024-03-26 NOTE — Progress Notes (Unsigned)
 "    GI Office Note    Referring Provider: Bernabe Irene Salm, [place] Primary Care Physician:  Bernabe Irene Salm, [place]  Primary Gastroenterologist: Ozell Hollingshead, MD   Chief Complaint   Chief Complaint  Patient presents with   Follow-up    Still having issues with being sick on her stomach, thinks her pantoprazole  may not be strong enough.    History of Present Illness   Janet Lawson is a 69 y.o. female presenting today for follow up. Last seen 07/2023.  History of GERD, cirrhosis.H/o adenomatous colon polyps in 2011. No polyps on colonoscopy in 2016. Last EGD in 2016 with hiatal hernia, gastric erosions, benign fundic gland polyp and gastritis but no hpylori.   Fibrosis of the liver (2016) noted on MRI, ultimately underwent liver biopsy (2018) which found fibrosis with no excessive iron, no alpha-1 antitrypsin deposits, overall nonspecific with the appearance of likely being caused by medications such as methotrexate.  No overt signs of cirrhosis at that time. Mildly nodular liver on ultrasound in September 2020. Hollie FibroSure with F2. CT A/P with contrast in 09/2021. Liver appeared cirrhotic on that study. Spleen normal. Preserved liver function by labs.   Discussed the use of AI scribe software for clinical note transcription with the patient, who gave verbal consent to proceed.  History of Present Illness   She has recurrent reflux with heat and burning in the chest after meals and at night, described as acid she cannot swallow down. Nighttime symptoms persist and make her feel yicky. She denies abdominal pain, hematochezia, or melena.  On Saturday morning she vomited after drinking coffee on an empty stomach without medications. Emesis was coffee-colored, associated with a hot sensation and transient malaise, then she improved after exposure to cool air and eating lunch. She had similar vomiting episodes in December.  Pantoprazole  is taken once daily in the morning with  breakfast. It had been effective but she now questions its benefit due to persistent symptoms. She previously used omeprazole  and ranitidine. There have been no recent medication changes.   She has chronic hypomagnesemia, per patient most recently 1.2. She takes magnesium  malate gummies, four twice daily under the direction of her cardiologist, which she tolerates with formed morning bowel movements and without significant diarrhea or GI bleeding. She uses Imodium prophylactically at times if going to church or to appointments. Repeat labs are planned for early February.  She stopped meloxicam  and ibuprofen since her EGD.       She uses an Omnipod insulin  pump with G7 sensor and admelog insulin  for diabetes management.     Prior Data     Results    Labs from 07/25/2023: Ferritin 117, iron 84, iron sats 27, TIBC 310, AFP 2.6, INR 1.2, sodium 140, creatinine 0.82, albumin 3.5, total bilirubin 0.8, alk phos 61, AST 26, ALT 19, white blood cell count 6.5, hemoglobin 12.8, platelets 169  Cologuard 03/2023 negative.  Abdominal ultrasound May 2025: IMPRESSION: 1. No gross focal liver lesion identified. Marked heterogeneous increased echotexture of the liver, nonspecific. The marked heterogeneous echotexture limits evaluation for focal liver lesion. Infiltrating process of the liver is not excluded. 2. Cholelithiasis without sonographic evidence of acute cholecystitis.  CT A/P with contrast 09/2021.  IMPRESSION: -No acute findings. -Hepatic cirrhosis. No evidence of hepatic neoplasm or abdominal metastatic disease. -Colonic diverticulosis, without radiographic evidence of diverticulitis.  EGD June 2025: - Normal esophagus - Medium sized hiatal hernia.  Gastric polyps status post removal of 1 polyp (  fundic gland), patchy gastric erythema status post biopsy focal mild chronic inactive gastritis and reactive epithelial changes, negative for intestinal metaplasia or dysplasia, no H.  pylori - Normal duodenal bulb and second portion of duodenum - Gastric inflammation likely NSAID related   Colonoscopy March 2016: Melanosis coli.  Colonic diverticulosis.  Next colonoscopy in 5 years.   EGD in March 2016: Normal esophagus.  Hiatal hernia.  Gastric polyps.  Gastric erosions.  Status post biopsy.  Benign fundic gland polyp.  Mild chronic gastritis with no H. pylori.     Medications   Current Outpatient Medications  Medication Sig Dispense Refill   ADMELOG 100 UNIT/ML injection SMARTSIG:0-100 Unit(s) injection Daily     apixaban  (ELIQUIS ) 5 MG TABS tablet Take 1 tablet (5 mg total) by mouth 2 (two) times daily. 60 tablet 3   EMBECTA PEN NEEDLE ULTRAFINE 32G X 6 MM MISC 4 (four) times daily.     furosemide (LASIX) 20 MG tablet Take 20 mg by mouth every morning.     gabapentin  (NEURONTIN ) 800 MG tablet Take 1 tablet by mouth 3 (three) times daily.     Insulin  Disposable Pump (OMNIPOD 5 [id number] PODS GEN 5) MISC SMARTSIG:SUB-Q Every Other Day     LIVALO 2 MG TABS Take 1 tablet by mouth daily.     losartan  (COZAAR ) 50 MG tablet Take 50 mg by mouth daily.      MAGNESIUM  MALATE PO Take 4 tablets by mouth in the morning and at bedtime.     meloxicam  (MOBIC ) 15 MG tablet Take 15 mg by mouth daily.      metFORMIN  (GLUCOPHAGE ) 500 MG tablet Take 500 mg by mouth daily.     pantoprazole  (PROTONIX ) 40 MG tablet TAKE ONE TABLET BY MOUTH DAILY BEFORE BREAKFAST 90 tablet 1   sertraline (ZOLOFT) 50 MG tablet Take 50 mg by mouth 2 (two) times daily.  1   sotalol (BETAPACE) 80 MG tablet Take 80 mg by mouth 2 (two) times daily.     TALTZ 80 MG/ML SOAJ Inject 80 mg into the skin every 28 (twenty-eight) days.     No current facility-administered medications for this visit.    Allergies   Allergies as of 03/26/2024 - Review Complete 03/26/2024  Allergen Reaction Noted   Acetaminophen  03/26/2024   Canagliflozin  03/26/2024   Celebrex [celecoxib]  02/24/2011   Magnesium  Diarrhea  03/26/2024   Procaine  03/26/2024   Semaglutide  03/26/2024   Sulfa antibiotics  02/24/2011   Sulfamethoxazole-trimethoprim Dermatitis 03/26/2024   Sulfur  06/07/2007     Past Medical History   Past Medical History:  Diagnosis Date   Adenomatous polyp of colon 11/2009   recommended repeat in 5 years   Atrial fibrillation with RVR (HCC)    Breast CA (HCC) 2010 (L) and 2012 (R)   Bilateral   Chemotherapy-induced neuropathy    bilateral hands and feet   Diabetes type 2, controlled (HCC)    Diverticulosis    Duodenal ulcer    Gastric ulcer    GERD (gastroesophageal reflux disease)    Hiatal hernia    HTN (hypertension)    Hypercholesteremia    Melanoma (HCC)    OA (osteoarthritis)    feets, hands, knees, back.   Psoriasis     Past Surgical History   Past Surgical History:  Procedure Laterality Date   BREAST LUMPECTOMY Bilateral 2010 (L) & 2012 (R)   COLONOSCOPY  11/2009   1 descending colon adenomatous polyp; repeat in  5 years   COLONOSCOPY N/A 05/29/2014   MFM:[id number] coli/colonic diverticulosis   ESOPHAGOGASTRODUODENOSCOPY N/A 05/29/2014   MFM:wnmfjo/[id number]/hjdumpr erosions s/p bx   ESOPHAGOGASTRODUODENOSCOPY N/A 08/24/2023   Procedure: EGD (ESOPHAGOGASTRODUODENOSCOPY);  Surgeon: Shaaron Lamar HERO, MD;  Location: [facility];  Service: Endoscopy;  Laterality: N/A;  11:00 am, asa 3   ESOPHAGOGASTRODUODENOSCOPY ENDOSCOPY  2001   Had stomach polyps (per patient); unable to find procedure notes.   INCONTINENCE SURGERY     KNEE ARTHROSCOPY     SUPERFICIAL LYMPH NODE BIOPSY / EXCISION Bilateral    Bilateral breast associated    Past Family History   Family History  Problem Relation Age of Onset   Heart disease Mother    High blood pressure Mother    Diabetes Mellitus II Mother    Colon cancer Neg Hx     Past Social History   Social History   Socioeconomic History   Marital status: Married    Spouse name: Not on file   Number of children: Not on file   Years  of education: Not on file   Highest education level: Not on file  Occupational History   Not on file  Tobacco Use   Smoking status: Never   Smokeless tobacco: Never  Substance and Sexual Activity   Alcohol use: No    Alcohol/week: 0.0 standard drinks of alcohol   Drug use: No   Sexual activity: Not Currently  Other Topics Concern   Not on file  Social History Narrative   Not on file   Social Drivers of Health   Tobacco Use: Low Risk (03/26/2024)   Patient History    Smoking Tobacco Use: Never    Smokeless Tobacco Use: Never    Passive Exposure: Not on file  Financial Resource Strain: Not on file  Food Insecurity: Not on file  Transportation Needs: Not on file  Physical Activity: Not on file  Stress: Not on file  Social Connections: Not on file  Intimate Partner Violence: Not on file  Depression (EYV7-0): Not on file  Alcohol Screen: Not on file  Housing: Not on file  Utilities: Not on file  Health Literacy: Not on file    Review of Systems   General: Negative for anorexia, weight loss, fever, chills, fatigue, weakness. ENT: Negative for hoarseness, difficulty swallowing , nasal congestion. CV: Negative for chest pain, angina, palpitations, dyspnea on exertion, peripheral edema.  Respiratory: Negative for dyspnea at rest, dyspnea on exertion, cough, sputum, wheezing.  GI: See history of present illness. GU:  Negative for dysuria, hematuria, urinary incontinence, urinary frequency, nocturnal urination.  Endo: Negative for unusual weight change.     Physical Exam   BP 133/76   Pulse 84   Temp 97.9 F (36.6 C) (Oral)   Ht 5' 4 (1.626 m)   Wt 208 lb 12.8 oz (94.7 kg)   SpO2 92%   BMI 35.84 kg/m    General: Well-nourished, well-developed in no acute distress.  Eyes: No icterus. Mouth: Oropharyngeal mucosa moist and pink   Lungs: Clear to auscultation bilaterally.  Heart: Regular rate and rhythm, no murmurs rubs or gallops.  Abdomen: Bowel sounds are normal,  nontender, nondistended, no hepatosplenomegaly or masses,  no abdominal bruits or hernia , no rebound or guarding.  Rectal: not performed Extremities: No lower extremity edema. No clubbing or deformities. Neuro: Alert and oriented x 4   Skin: Warm and dry, no jaundice.   Psych: Alert and cooperative, normal mood and affect.  Labs  See above  Imaging Studies   No results found.  Assessment/Plan:    Assessment & Plan Gastroesophageal reflux disease - Breakthrough reflux symptoms on pantoprazole . - Recent EGD as outlined - Discontinued meloxicam  and ibuprofen due to gastrointestinal risk. - Transition from pantoprazole  to rabeprazole  20mg  daily before breakfast.  - Reach out if symptoms do not improve   Hepatic cirrhosis -liver biopsy in 2018 with portal fibrosis with mild steatosis thought to be medication related, possibly methotrexate. -CT A/P 09/2021, findings of hepatic cirrhosis without portal HTN -U/S 07/2023 limited due to marked heterogeneous echotexture -hepatic function has been preserved so far -update labs and hepatoma screening. Patient agreeable to MRI this time to have better look at liver -check Hep A/B immunity status with next labs -return ov in six months.    Hypomagnesemia Chronic hypomagnesemia, managed by cardiology, currently on magnesium  malate gummies. -hypomagnesemia present for several years -etiology unclear, briefly discussed possible relationship to chronic PPI use -currently GERD refractory and unlikely to tolerate PPI but would consider trial off in the future          [place] S. Ezzard, MHS, PA-C [facility]  "

## 2024-03-26 NOTE — Telephone Encounter (Signed)
 Availity PA: Approved ,Certification Number [id number] Service From - 2024-03-26 To Date 2024-06-25

## 2024-03-27 ENCOUNTER — Telehealth: Payer: Self-pay | Admitting: Gastroenterology

## 2024-03-27 NOTE — Telephone Encounter (Signed)
 Tammy, can we add Hep B surf antibody, Hep B core total Ab, Hep A total Ab? Dx: cirrhosis. She may have labs done closer to home

## 2024-03-27 NOTE — Telephone Encounter (Signed)
 Pt was unable to get labs done yesterday but stated that she will be doing them at [facility] when she goes for her MRI. Labs from yesterday were reordered along with the requested add ons. Pt was instructed not to take orders from yesterday but to let [facility] know that she has orders to complete in the system. Pt verbalized understanding.

## 2024-03-27 NOTE — Addendum Note (Signed)
 Addended by: WELLINGTON MILLING on: 03/27/2024 08:28 AM   Modules accepted: Orders

## 2024-03-28 ENCOUNTER — Ambulatory Visit (HOSPITAL_COMMUNITY)
Admission: RE | Admit: 2024-03-28 | Discharge: 2024-03-28 | Disposition: A | Discharge status: Home / Self Care | Source: Ambulatory Visit | Attending: Gastroenterology | Admitting: Gastroenterology

## 2024-03-28 DIAGNOSIS — K219 Gastro-esophageal reflux disease without esophagitis: Secondary | ICD-10-CM | POA: Diagnosis present

## 2024-03-28 DIAGNOSIS — K7460 Unspecified cirrhosis of liver: Secondary | ICD-10-CM | POA: Insufficient documentation

## 2024-03-28 MED ORDER — GADOBUTROL 1 MMOL/ML IV SOLN
9.5000 mL | Freq: Once | INTRAVENOUS | Status: AC | PRN
  Administered 2024-03-28: 9.5 mL via INTRAVENOUS

## 2024-03-29 LAB — CBC WITH DIFFERENTIAL/PLATELET
Basophils Absolute: 0.1 x10E3/uL (ref 0.0–0.2)
Basos: 1 %
EOS (ABSOLUTE): 0.1 x10E3/uL (ref 0.0–0.4)
Eos: 2 %
Hematocrit: 42.4 % (ref 34.0–46.6)
Hemoglobin: 14.3 g/dL (ref 11.1–15.9)
Immature Grans (Abs): 0 x10E3/uL (ref 0.0–0.1)
Immature Granulocytes: 0 %
Lymphocytes Absolute: 1 x10E3/uL (ref 0.7–3.1)
Lymphs: 15 %
MCH: 31.2 pg (ref 26.6–33.0)
MCHC: 33.7 g/dL (ref 31.5–35.7)
MCV: 93 fL (ref 79–97)
Monocytes Absolute: 0.3 x10E3/uL (ref 0.1–0.9)
Monocytes: 5 %
Neutrophils Absolute: 5 x10E3/uL (ref 1.4–7.0)
Neutrophils: 77 %
Platelets: 156 x10E3/uL (ref 150–450)
RBC: 4.58 x10E6/uL (ref 3.77–5.28)
RDW: 13.5 % (ref 11.7–15.4)
WBC: 6.5 x10E3/uL (ref 3.4–10.8)

## 2024-03-29 LAB — COMPREHENSIVE METABOLIC PANEL WITH GFR
ALT: 18 IU/L (ref 0–32)
AST: 25 IU/L (ref 0–40)
Albumin: 4.1 g/dL (ref 3.9–4.9)
Alkaline Phosphatase: 83 IU/L (ref 49–135)
BUN/Creatinine Ratio: 18 (ref 12–28)
BUN: 15 mg/dL (ref 8–27)
Bilirubin Total: 0.7 mg/dL (ref 0.0–1.2)
CO2: 21 mmol/L (ref 20–29)
Calcium: 8.8 mg/dL (ref 8.7–10.3)
Chloride: 103 mmol/L (ref 96–106)
Creatinine, Ser: 0.85 mg/dL (ref 0.57–1.00)
Globulin, Total: 2.6 g/dL (ref 1.5–4.5)
Glucose: 229 mg/dL — ABNORMAL HIGH (ref 70–99)
Potassium: 4.4 mmol/L (ref 3.5–5.2)
Sodium: 140 mmol/L (ref 134–144)
Total Protein: 6.7 g/dL (ref 6.0–8.5)
eGFR: 74 mL/min/1.73

## 2024-03-29 LAB — AFP TUMOR MARKER: AFP, Serum, Tumor Marker: 2.8 ng/mL (ref 0.0–9.2)

## 2024-03-29 LAB — PROTIME-INR
INR: 1.1 (ref 0.9–1.2)
Prothrombin Time: 11.9 s (ref 9.1–12.0)

## 2024-03-29 LAB — HEPATITIS B CORE ANTIBODY, TOTAL: Hep B Core Total Ab: NEGATIVE

## 2024-03-29 LAB — HEPATITIS B SURFACE ANTIBODY,QUALITATIVE: Hep B Surface Ab, Qual: NONREACTIVE

## 2024-03-29 LAB — HEPATITIS A ANTIBODY, TOTAL: hep A Total Ab: POSITIVE — AB

## 2024-03-29 NOTE — Telephone Encounter (Signed)
 noted

## 2024-04-07 ENCOUNTER — Ambulatory Visit: Payer: Self-pay | Admitting: Gastroenterology

## 2024-04-10 ENCOUNTER — Telehealth: Payer: Self-pay

## 2024-04-10 MED ORDER — FAMOTIDINE 20 MG PO TABS: 20.0000 mg | ORAL_TABLET | Freq: Every day | ORAL | 3 refills | Status: AC

## 2024-04-10 NOTE — Telephone Encounter (Signed)
 Pt states that her rabeprazole  is not lasting all day that she gets indigestion around 10/11pm and is wanting to know what you suggest.

## 2024-04-10 NOTE — Telephone Encounter (Signed)
 Pt was made aware and verbalized understanding.

## 2024-04-10 NOTE — Addendum Note (Signed)
 Addended by: EZZARD SONNY RAMAN on: 04/10/2024 01:50 PM   Modules accepted: Orders

## 2024-04-10 NOTE — Telephone Encounter (Signed)
 Let's add famotidine  20mg  in the evening. RX sent.  Continue rabeprazole  before breakfast.

## 2024-04-11 NOTE — Progress Notes (Signed)
 NIC'd 1 yr MRI/MRCP and also OV for 6 months

## 2024-09-24 ENCOUNTER — Ambulatory Visit: Admitting: Gastroenterology
